# Patient Record
Sex: Male | Born: 1955 | Race: White | Hispanic: No | Marital: Married | Smoking: Never smoker
Health system: Southern US, Community
[De-identification: ages and names within clinical notes are randomized; demographics above are authoritative.]

## PROBLEM LIST (undated history)

## (undated) DIAGNOSIS — F419 Anxiety disorder, unspecified: Secondary | ICD-10-CM

## (undated) DIAGNOSIS — K219 Gastro-esophageal reflux disease without esophagitis: Secondary | ICD-10-CM

## (undated) DIAGNOSIS — E039 Hypothyroidism, unspecified: Secondary | ICD-10-CM

## (undated) DIAGNOSIS — L501 Idiopathic urticaria: Secondary | ICD-10-CM

## (undated) DIAGNOSIS — M79671 Pain in right foot: Secondary | ICD-10-CM

## (undated) DIAGNOSIS — M2041 Other hammer toe(s) (acquired), right foot: Secondary | ICD-10-CM

## (undated) DIAGNOSIS — M310 Hypersensitivity angiitis: Secondary | ICD-10-CM

## (undated) DIAGNOSIS — M545 Low back pain, unspecified: Secondary | ICD-10-CM

## (undated) HISTORY — DX: Anxiety disorder, unspecified: F41.9

## (undated) HISTORY — PX: COLONOSCOPY: SHX174

## (undated) HISTORY — DX: Hypersensitivity angiitis: M31.0

## (undated) HISTORY — DX: Idiopathic urticaria: L50.1

## (undated) HISTORY — DX: Other hammer toe(s) (acquired), right foot: M20.41

## (undated) HISTORY — DX: Low back pain, unspecified: M54.50

## (undated) HISTORY — DX: Pain in right foot: M79.671

---

## 2014-11-15 ENCOUNTER — Ambulatory Visit (HOSPITAL_COMMUNITY)
Admission: RE | Admit: 2014-11-15 | Discharge: 2014-11-15 | Disposition: A | Discharge status: Home / Self Care | Payer: 59 | Source: Ambulatory Visit | Attending: Orthopedic Surgery | Admitting: Orthopedic Surgery

## 2014-11-15 ENCOUNTER — Other Ambulatory Visit (HOSPITAL_COMMUNITY): Payer: Self-pay | Admitting: Orthopedic Surgery

## 2014-11-15 DIAGNOSIS — Z139 Encounter for screening, unspecified: Secondary | ICD-10-CM | POA: Diagnosis present

## 2015-10-06 ENCOUNTER — Encounter: Payer: Self-pay | Admitting: Allergy and Immunology

## 2015-10-06 ENCOUNTER — Ambulatory Visit (INDEPENDENT_AMBULATORY_CARE_PROVIDER_SITE_OTHER): Payer: 59 | Admitting: Allergy and Immunology

## 2015-10-06 VITALS — BP 110/68 | HR 84 | Resp 16

## 2015-10-06 DIAGNOSIS — L509 Urticaria, unspecified: Secondary | ICD-10-CM

## 2015-10-06 MED ORDER — HYDROXYZINE HCL 10 MG PO TABS: ORAL_TABLET | ORAL | Status: DC

## 2015-10-06 NOTE — Patient Instructions (Signed)
Take Home Sheet  1. Avoidance:  Of scratching and triggers as discussed.   2. Antihistamine: Zyrtec 10mg  by mouth twice daily for itching.  3. Continue Zantac 150mg  twice daily.  4.  Hydroxyzine 10mg  at needed for itching/hives.  5.  Continue Dapsone per Dr. Sherryl Bartersafeen's instructions.  6. Continue Singulair 10mg  each evening.  7. Other:  May have at home Prednisone 10mg  tablets --total of 4 tabs--call office if needed.   Review Xolair information   Review with Dr. Dimas AguasHoward sleep management                         Review with Orthopedic MD pain management options as discussed.   8. Follow up Visit:  4 months or sooner if needed.    Call office with decision about submitting to insurance for Xolair approval and or keeping [facility]     Allergy/Immunology appointment.   Websites that have reliable Patient information: 1. American Academy of Asthma, Allergy, & Immunology: www.aaaai.org 2. Food Allergy Network: www.foodallergy.org 3. Mothers of Asthmatics: www.aanma.org 4. [facility]: https://www.strong.com/www.njc.org 5. American College of Allergy, Asthma, & Immunology: BiggerRewards.iswww.allergy.mcg.edu or www.acaai.org

## 2015-10-18 NOTE — Progress Notes (Signed)
FOLLOW UP NOTE  RE: Troy Orozco MRN: [id number] DOB: [birth year] [facility] [place] [address] [place] [id number] Date of Office Visit: 10/06/2015  Subjective:  Troy MalkinDavid Orozco is a 59 y.o. male who presents today for Urticaria  Assessment:   1. Hives, chronic.   Plan:   Meds ordered this encounter  Medications  . hydrOXYzine (ATARAX/VISTARIL) 10 MG tablet    Sig: Take 1-2 tablets daily at bedtime as needed for itching.    Dispense:  60 tablet    Refill:  1   Patient Instructions   1. Avoidance:  Of scratching and triggers as discussed.  2. Antihistamine: Zyrtec 10mg  by mouth twice daily for itching.  3. Continue Zantac 150mg  twice daily.  4.  Hydroxyzine 10mg  at needed for itching/hives.  5.  Continue Dapsone per Dr. Sherryl Bartersafeen's instructions.  6. Continue Singulair 10mg  each evening.  7. Other:  May have at home Prednisone 10mg  tablets --total of 4 tabs--call office if needed.   Review Xolair information   Review with Dr. Dimas AguasHoward sleep management                         Review with Orthopedic MD pain management options as discussed.   8. Follow up Visit:  4 months or sooner if needed.    Call office with decision about submitting to insurance for Xolair approval and or keeping [facility] All[facility] appointment.    HPI: Troy Orozco to the office regarding hives.  Since his last visit he has had visits with Dr. Jorja Loaafeen, last in July.  He generally describes much better control since increased dose of Dapsone.  No severe flares in many months.  He took Prednisone 3 weeks ago from primary MD for a few days of mild hives.  He occasionally notes mild, though rare areas at arms/sides and occasionally at neck and lower back.  No recent itching.  And denies swelling or new concerns for GI or respiratory symptoms. He does report following with Orthopedics regarding right rotator cuff and biceps  tendon--recurring pain and third shift sleep issues. He is requesting to have a few Prednisone at home if hive flare.  Current Medications: 1.  Zyrtec 10mg  twice daily. 2.  Singulair 10mg  each evening. 3.  Dapsone 100mg  daily (3 months at this dose). 4.  Hydroxyzine as needed. 5.  Zantac 150mg  twice daily.  Drug Allergies: No Known Allergies  Objective:   Filed Vitals:   10/06/15 0928                        10/05/15 0843  BP:              110/68                                       152/86  Pulse:                                                     84  Resp:  16   Physical Exam  Constitutional: He is well-developed, well-nourished, and in no distress.  HENT:  Head: Atraumatic.  Right Ear: Tympanic membrane and ear canal normal.  Left Ear: Tympanic membrane and ear canal normal.  Nose: Mucosal edema present. No rhinorrhea. No epistaxis.  Mouth/Throat: Oropharynx is clear and moist and mucous membranes are normal. No oropharyngeal exudate, posterior oropharyngeal edema or posterior oropharyngeal erythema.  Eyes: Conjunctivae are normal.  Neck: Neck supple.  Cardiovascular: Normal rate, S1 normal and S2 normal.   No murmur heard. Pulmonary/Chest: Effort normal and breath sounds normal. He has no wheezes. He has no rhonchi. He has no rales.  Lymphadenopathy:    He has no cervical adenopathy.  Skin: Skin is warm and intact. No rash noted. No cyanosis. Nails show no clubbing.  No hives or skin changes.     Bennette Hasty M. Willa Rough, MD  cc: Selinda Flavin, MD

## 2016-02-02 ENCOUNTER — Ambulatory Visit: Payer: 59 | Admitting: Allergy and Immunology

## 2018-07-14 NOTE — Patient Instructions (Addendum)
Towanda MalkinDavid Rothenberger  07/14/2018   Your procedure is scheduled on: 07-28-18   Report to [facility] Main  Entrance    Report to Admitting at 12:20 PM    Call this number if you have problems the morning of surgery [phone]   Remember: Do not eat food or drink liquids :After Midnight. You may have a Clear Liquid Diet from Midnight until 8:50 AM. After 8:50 AM, nothing until after surgery.     CLEAR LIQUID DIET   Foods Allowed                                                                     Foods Excluded  Coffee and tea, regular and decaf                             liquids that you cannot  Plain Jell-O in any flavor                                             see through such as: Fruit ices (not with fruit pulp)                                     milk, soups, orange juice  Iced Popsicles                                    All solid food Carbonated beverages, regular and diet                                    Cranberry, grape and apple juices Sports drinks like Gatorade Lightly seasoned clear broth or consume(fat free) Sugar, honey syrup  Sample Menu Breakfast                                Lunch                                     Supper Cranberry juice                    Beef broth                            Chicken broth Jell-O                                     Grape juice  Apple juice Coffee or tea                        Jell-O                                      Popsicle                                                Coffee or tea                        Coffee or tea  _____________________________________________________________________     Take these medicines the morning of surgery with A SIP OF WATER: Cetirizine (Zyrtec), and Ranitidine (Zantac)                                You may not have any metal on your body including hair pins and              piercings  Do not wear jewelry, lotions, powders, cologne or  deodorant             Men may shave face and neck.   Do not bring valuables to the hospital. [facility] IS NOT             RESPONSIBLE   FOR VALUABLES.  Contacts, dentures or bridgework may not be worn into surgery.  Leave suitcase in the car. After surgery it may be brought to your room.   Special Instructions: N/A              Please read over the following fact sheets you were given: _____________________________________________________________________          [facility] - Preparing for Surgery Before surgery, you can play an important role.  Because skin is not sterile, your skin needs to be as free of germs as possible.  You can reduce the number of germs on your skin by washing with CHG (chlorahexidine gluconate) soap before surgery.  CHG is an antiseptic cleaner which kills germs and bonds with the skin to continue killing germs even after washing. Please DO NOT use if you have an allergy to CHG or antibacterial soaps.  If your skin becomes reddened/irritated stop using the CHG and inform your nurse when you arrive at Short Stay. Do not shave (including legs and underarms) for at least 48 hours prior to the first CHG shower.  You may shave your face/neck. Please follow these instructions carefully:  1.  Shower with CHG Soap the night before surgery and the  morning of Surgery.  2.  If you choose to wash your hair, wash your hair first as usual with your  normal  shampoo.  3.  After you shampoo, rinse your hair and body thoroughly to remove the  shampoo.                           4.  Use CHG as you would any other liquid soap.  You can apply chg directly  to the skin and wash  Gently with a scrungie or clean washcloth.  5.  Apply the CHG Soap to your body ONLY FROM THE NECK DOWN.   Do not use on face/ open                           Wound or open sores. Avoid contact with eyes, ears mouth and genitals (private parts).                       Wash face,  Genitals  (private parts) with your normal soap.             6.  Wash thoroughly, paying special attention to the area where your surgery  will be performed.  7.  Thoroughly rinse your body with warm water from the neck down.  8.  DO NOT shower/wash with your normal soap after using and rinsing off  the CHG Soap.                9.  Pat yourself dry with a clean towel.            10.  Wear clean pajamas.            11.  Place clean sheets on your bed the night of your first shower and do not  sleep with pets. Day of Surgery : Do not apply any lotions/deodorants the morning of surgery.  Please wear clean clothes to the hospital/surgery center.  FAILURE TO FOLLOW THESE INSTRUCTIONS MAY RESULT IN THE CANCELLATION OF YOUR SURGERY PATIENT SIGNATURE_________________________________  NURSE SIGNATURE__________________________________  ________________________________________________________________________   Adam Phenix  An incentive spirometer is a tool that can help keep your lungs clear and active. This tool measures how well you are filling your lungs with each breath. Taking long deep breaths may help reverse or decrease the chance of developing breathing (pulmonary) problems (especially infection) following:  A long period of time when you are unable to move or be active. BEFORE THE PROCEDURE   If the spirometer includes an indicator to show your best effort, your nurse or respiratory therapist will set it to a desired goal.  If possible, sit up straight or lean slightly forward. Try not to slouch.  Hold the incentive spirometer in an upright position. INSTRUCTIONS FOR USE  1. Sit on the edge of your bed if possible, or sit up as far as you can in bed or on a chair. 2. Hold the incentive spirometer in an upright position. 3. Breathe out normally. 4. Place the mouthpiece in your mouth and seal your lips tightly around it. 5. Breathe in slowly and as deeply as possible, raising the  piston or the ball toward the top of the column. 6. Hold your breath for 3-5 seconds or for as long as possible. Allow the piston or ball to fall to the bottom of the column. 7. Remove the mouthpiece from your mouth and breathe out normally. 8. Rest for a few seconds and repeat Steps 1 through 7 at least 10 times every 1-2 hours when you are awake. Take your time and take a few normal breaths between deep breaths. 9. The spirometer may include an indicator to show your best effort. Use the indicator as a goal to work toward during each repetition. 10. After each set of 10 deep breaths, practice coughing to be sure your lungs are clear. If you have an incision (the cut made at the time of  surgery), support your incision when coughing by placing a pillow or rolled up towels firmly against it. Once you are able to get out of bed, walk around indoors and cough well. You may stop using the incentive spirometer when instructed by your caregiver.  RISKS AND COMPLICATIONS  Take your time so you do not get dizzy or light-headed.  If you are in pain, you may need to take or ask for pain medication before doing incentive spirometry. It is harder to take a deep breath if you are having pain. AFTER USE  Rest and breathe slowly and easily.  It can be helpful to keep track of a log of your progress. Your caregiver can provide you with a simple table to help with this. If you are using the spirometer at home, follow these instructions: [place] IF:   You are having difficultly using the spirometer.  You have trouble using the spirometer as often as instructed.  Your pain medication is not giving enough relief while using the spirometer.  You develop fever of 100.5 F (38.1 C) or higher. SEEK IMMEDIATE MEDICAL CARE IF:   You cough up bloody sputum that had not been present before.  You develop fever of 102 F (38.9 C) or greater.  You develop worsening pain at or near the incision  site. MAKE SURE YOU:   Understand these instructions.  Will watch your condition.  Will get help right away if you are not doing well or get worse. Document Released: 04/01/2007 Document Revised: 02/11/2012 Document Reviewed: 06/02/2007 ExitCare Patient Information 2014 ExitCare, [place].   ________________________________________________________________________  WHAT IS A BLOOD TRANSFUSION? Blood Transfusion Information  A transfusion is the replacement of blood or some of its parts. Blood is made up of multiple cells which provide different functions.  Red blood cells carry oxygen and are used for blood loss replacement.  White blood cells fight against infection.  Platelets control bleeding.  Plasma helps clot blood.  Other blood products are available for specialized needs, such as hemophilia or other clotting disorders. BEFORE THE TRANSFUSION  Who gives blood for transfusions?   Healthy volunteers who are fully evaluated to make sure their blood is safe. This is blood bank blood. Transfusion therapy is the safest it has ever been in the practice of medicine. Before blood is taken from a donor, a complete history is taken to make sure that person has no history of diseases nor engages in risky social behavior (examples are intravenous drug use or sexual activity with multiple partners). The donor's travel history is screened to minimize risk of transmitting infections, such as malaria. The donated blood is tested for signs of infectious diseases, such as HIV and hepatitis. The blood is then tested to be sure it is compatible with you in order to minimize the chance of a transfusion reaction. If you or a relative donates blood, this is often done in anticipation of surgery and is not appropriate for emergency situations. It takes many days to process the donated blood. RISKS AND COMPLICATIONS Although transfusion therapy is very safe and saves many lives, the main dangers of  transfusion include:   Getting an infectious disease.  Developing a transfusion reaction. This is an allergic reaction to something in the blood you were given. Every precaution is taken to prevent this. The decision to have a blood transfusion has been considered carefully by your caregiver before blood is given. Blood is not given unless the benefits outweigh the risks. AFTER THE  TRANSFUSION  Right after receiving a blood transfusion, you will usually feel much better and more energetic. This is especially true if your red blood cells have gotten low (anemic). The transfusion raises the level of the red blood cells which carry oxygen, and this usually causes an energy increase.  The nurse administering the transfusion will monitor you carefully for complications. HOME CARE INSTRUCTIONS  No special instructions are needed after a transfusion. You may find your energy is better. Speak with your caregiver about any limitations on activity for underlying diseases you may have. SEEK MEDICAL CARE IF:   Your condition is not improving after your transfusion.  You develop redness or irritation at the intravenous (IV) site. SEEK IMMEDIATE MEDICAL CARE IF:  Any of the following symptoms occur over the next 12 hours:  Shaking chills.  You have a temperature by mouth above 102 F (38.9 C), not controlled by medicine.  Chest, back, or muscle pain.  People around you feel you are not acting correctly or are confused.  Shortness of breath or difficulty breathing.  Dizziness and fainting.  You get a rash or develop hives.  You have a decrease in urine output.  Your urine turns a dark color or changes to pink, red, or Barbian. Any of the following symptoms occur over the next 10 days:  You have a temperature by mouth above 102 F (38.9 C), not controlled by medicine.  Shortness of breath.  Weakness after normal activity.  The white part of the eye turns yellow (jaundice).  You have a  decrease in the amount of urine or are urinating less often.  Your urine turns a dark color or changes to pink, red, or [place]. Document Released: 11/16/2000 Document Revised: 02/11/2012 Document Reviewed: 07/05/2008 [facility] Patient Information 2014 [place], [place].  _______________________________________________________________________

## 2018-07-16 ENCOUNTER — Encounter (HOSPITAL_COMMUNITY): Payer: Self-pay

## 2018-07-16 ENCOUNTER — Encounter (HOSPITAL_COMMUNITY)
Admission: RE | Admit: 2018-07-16 | Discharge: 2018-07-16 | Disposition: A | Discharge status: Home / Self Care | Payer: POS | Source: Ambulatory Visit | Attending: Orthopedic Surgery | Admitting: Orthopedic Surgery

## 2018-07-16 ENCOUNTER — Other Ambulatory Visit: Payer: Self-pay

## 2018-07-16 DIAGNOSIS — Z01812 Encounter for preprocedural laboratory examination: Secondary | ICD-10-CM | POA: Insufficient documentation

## 2018-07-16 DIAGNOSIS — M1612 Unilateral primary osteoarthritis, left hip: Secondary | ICD-10-CM | POA: Insufficient documentation

## 2018-07-16 HISTORY — DX: Hypothyroidism, unspecified: E03.9

## 2018-07-16 HISTORY — DX: Gastro-esophageal reflux disease without esophagitis: K21.9

## 2018-07-16 LAB — PROTIME-INR
INR: 0.98
Prothrombin Time: 12.8 seconds (ref 11.4–15.2)

## 2018-07-16 LAB — SURGICAL PCR SCREEN
MRSA, PCR: NEGATIVE
STAPHYLOCOCCUS AUREUS: NEGATIVE

## 2018-07-16 LAB — APTT: APTT: 32 s (ref 24–36)

## 2018-07-16 NOTE — Progress Notes (Addendum)
07-15-18 Surgical Clearance,  EKG, CBC, CMET, and HGA1C on chart

## 2018-07-24 NOTE — H&P (Signed)
TOTAL HIP ADMISSION H&P  Patient is admitted for left total hip arthroplasty.  Subjective:  Chief Complaint: left hip pain  HPI: Troy Orozco, 62 y.o. male, has a history of pain and functional disability in the left hip(s) due to arthritis and patient has failed non-surgical conservative treatments for greater than 12 weeks to include NSAID's and/or analgesics and activity modification.  Onset of symptoms was abrupt starting 1 years ago with rapidly worsening course since that time.The patient noted no past surgery on the left hip(s).  Patient currently rates pain in the left hip at 7 out of 10 with activity. Patient has worsening of pain with activity and weight bearing and pain with passive range of motion. Patient has evidence of bone-on-bone arthritis with marginal osteophyte formation by imaging studies. This condition presents safety issues increasing the risk of falls. There is no current active infection.  There are no active problems to display for this patient.  Past Medical History:  Diagnosis Date  . GERD (gastroesophageal reflux disease)   . Hypothyroidism     Past Surgical History:  Procedure Laterality Date  . COLONOSCOPY      No current facility-administered medications for this encounter.    Current Outpatient Medications  Medication Sig Dispense Refill Last Dose  . acetaminophen (TYLENOL 8 HOUR ARTHRITIS PAIN) 650 MG CR tablet Take 650-1,300 mg by mouth every 8 (eight) hours as needed for pain.     . Ascorbic Acid (VITAMIN C) 1000 MG tablet Take 1,000 mg by mouth daily.     . B Complex-C (SUPER B COMPLEX PO) Take 1 capsule by mouth daily.     . Cholecalciferol (VITAMIN D-3) 5000 units TABS Take 5,000 Units by mouth daily.     . dapsone 25 MG tablet Take 25 mg by mouth daily.   8   . Histamine Dihydrochloride (AUSTRALIAN DREAM ARTHRITIS EX) Apply 1 application topically 4 (four) times daily as needed (for pain.).     Marland Kitchen levothyroxine (SYNTHROID, LEVOTHROID) 50 MCG  tablet Take 50 mcg by mouth daily.  2   . lidocaine (XYLOCAINE) 5 % ointment Apply 1-2 g topically 4 (four) times daily as needed for pain.  0   . Magnesium 250 MG TABS Take 250 mg by mouth daily.     . meloxicam (MOBIC) 15 MG tablet Take 15 mg by mouth daily.  1   . Multiple Vitamin (MULTIVITAMIN WITH MINERALS) TABS tablet Take 1 tablet by mouth daily.     . NONFORMULARY OR COMPOUNDED ITEM Apply 1-2 g topically 4 (four) times daily as needed for pain. C-Bacl-Gaba-Ibu-Pril-2%-2%-5%-2% Cr  99    No Known Allergies  Social History   Tobacco Use  . Smoking status: Never Smoker  . Smokeless tobacco: Never Used  Substance Use Topics  . Alcohol use: Yes    Alcohol/week: 1.0 standard drinks    Types: 1 Cans of beer per week    Comment: weekly    No family history on file.   Review of Systems  Constitutional: Negative for chills and fever.  HENT: Negative for congestion, sore throat and tinnitus.   Eyes: Negative for double vision, photophobia and pain.  Respiratory: Negative for cough, shortness of breath and wheezing.   Cardiovascular: Negative for chest pain, palpitations and orthopnea.  Gastrointestinal: Negative for heartburn, nausea and vomiting.  Genitourinary: Negative for dysuria, frequency and urgency.  Musculoskeletal: Positive for joint pain.  Neurological: Negative for dizziness, weakness and headaches.  Psychiatric/Behavioral: Negative for depression.  Objective:  Physical Exam  Well nourished and well developed. General: Alert and oriented x3, cooperative and pleasant, no acute distress. Head: normocephalic, atraumatic, neck supple. Eyes: EOMI. Respiratory: breath sounds clear in all fields, no wheezing, rales, or rhonchi. Cardiovascular: Regular rate and rhythm, no murmurs, gallops or rubs.  Abdomen: non-tender to palpation and soft, normoactive bowel sounds. Musculoskeletal: Antalgic gait pattern favoring the left side without the use of assisted  devices. Right Hip Exam: ROM: Normal without discomfort. There is no tenderness over the greater trochanter bursa. There is no pain on provocative testing of the hip. Right Knee Exam: No effusion. Range of motion is 0-125 degrees. No crepitus on range of motion of the knee. No medial joint line tendernessNo lateral joint line tenderness. Stable knee. Left Hip Exam:ROM: Flexion to 100, Internal Rotation 0, External Rotation 10, and Abduction 20 degrees. There is no tenderness over the greater trochanter bursa. Left Knee Exam: No effusion. Range of motion is 0-125 degrees. No crepitus on range of motion of the knee. No medial joint line tendernessNo lateral joint line tenderness. Stable knee. Calves soft and nontender. Motor function intact in LE. Strength 5/5 LE bilaterally. Neuro: Distal pulses 2+. Sensation to light touch intact in LE.  Vital signs in last 24 hours:  Blood pressure: 138/86 mmHg Pulse: 84 bpm  Labs:   Estimated body mass index is 31.36 kg/m as calculated from the following:   Height as of 07/16/18: 5\' 7"  (1.702 m).   Weight as of 07/16/18: 90.8 kg.   Imaging Review Plain radiographs demonstrate severe degenerative joint disease of the left hip(s). The bone quality appears to be adequate for age and reported activity level.    Preoperative templating of the joint replacement has been completed, documented, and submitted to the Operating Room personnel in order to optimize intra-operative equipment management.     Assessment/Plan:  End stage arthritis, left hip(s)  The patient history, physical examination, clinical judgement of the provider and imaging studies are consistent with end stage degenerative joint disease of the left hip(s) and total hip arthroplasty is deemed medically necessary. The treatment options including medical management, injection therapy, arthroscopy and arthroplasty were discussed at length. The risks and benefits of total hip arthroplasty were  presented and reviewed. The risks due to aseptic loosening, infection, stiffness, dislocation/subluxation,  thromboembolic complications and other imponderables were discussed.  The patient acknowledged the explanation, agreed to proceed with the plan and consent was signed. Patient is being admitted for inpatient treatment for surgery, pain control, PT, OT, prophylactic antibiotics, VTE prophylaxis, progressive ambulation and ADL's and discharge planning.The patient is planning to be discharged home with home exercise program.    Therapy Plans: HEP Disposition: Home with wife and son Planned DVT Prophylaxis: Aspirin 325 mg BID DME needed: None PCP: Dr. Selinda FlavinKevin Howard ([facility]) TXA: IV Allergies: None Other: Pt had appointment with Dr. Dimas AguasHoward approximately 3 weeks ago. Clearance form provided to the patient to take by office.   - Patient was instructed on what medications to stop prior to surgery. - Follow-up visit in 2 weeks with Dr. Lequita HaltAluisio - Begin physical therapy following surgery - Pre-operative lab work as pre-surgical testing - Prescriptions will be provided in hospital at time of discharge  Arther AbbottKristie Fiona Coto, PA-C Orthopedic Surgery [facility] [place]

## 2018-07-25 NOTE — Progress Notes (Signed)
07-25-18 Pt made aware that surgery time has changed from 1450 to 1400. Pt to arrive at Admission at 1200, and to remain on a Clear Liquid Diet until 8:00 AM. After 8:00 AM, nothing until after surgery. Pt verbalized understanding.

## 2018-07-28 ENCOUNTER — Ambulatory Visit (HOSPITAL_COMMUNITY): Payer: POS

## 2018-07-28 ENCOUNTER — Ambulatory Visit (HOSPITAL_COMMUNITY): Payer: POS | Admitting: Anesthesiology

## 2018-07-28 ENCOUNTER — Inpatient Hospital Stay (HOSPITAL_COMMUNITY)
Admission: RE | Admit: 2018-07-28 | Discharge: 2018-07-29 | DRG: 470 | Disposition: A | Discharge status: Home Health | Payer: POS | Source: Ambulatory Visit | Attending: Orthopedic Surgery | Admitting: Orthopedic Surgery

## 2018-07-28 ENCOUNTER — Other Ambulatory Visit: Payer: Self-pay

## 2018-07-28 ENCOUNTER — Inpatient Hospital Stay (HOSPITAL_COMMUNITY): Payer: POS

## 2018-07-28 ENCOUNTER — Encounter (HOSPITAL_COMMUNITY): Payer: Self-pay | Admitting: *Deleted

## 2018-07-28 ENCOUNTER — Encounter (HOSPITAL_COMMUNITY)
Admission: RE | Disposition: A | Discharge status: Home Health | Payer: Self-pay | Source: Ambulatory Visit | Attending: Orthopedic Surgery

## 2018-07-28 DIAGNOSIS — Z96649 Presence of unspecified artificial hip joint: Secondary | ICD-10-CM

## 2018-07-28 DIAGNOSIS — E039 Hypothyroidism, unspecified: Secondary | ICD-10-CM | POA: Diagnosis present

## 2018-07-28 DIAGNOSIS — M1710 Unilateral primary osteoarthritis, unspecified knee: Secondary | ICD-10-CM | POA: Diagnosis present

## 2018-07-28 DIAGNOSIS — Z791 Long term (current) use of non-steroidal anti-inflammatories (NSAID): Secondary | ICD-10-CM

## 2018-07-28 DIAGNOSIS — K219 Gastro-esophageal reflux disease without esophagitis: Secondary | ICD-10-CM | POA: Diagnosis present

## 2018-07-28 DIAGNOSIS — M1612 Unilateral primary osteoarthritis, left hip: Secondary | ICD-10-CM | POA: Diagnosis present

## 2018-07-28 DIAGNOSIS — M179 Osteoarthritis of knee, unspecified: Secondary | ICD-10-CM | POA: Diagnosis present

## 2018-07-28 DIAGNOSIS — Z79890 Hormone replacement therapy (postmenopausal): Secondary | ICD-10-CM | POA: Diagnosis not present

## 2018-07-28 DIAGNOSIS — M169 Osteoarthritis of hip, unspecified: Secondary | ICD-10-CM | POA: Diagnosis present

## 2018-07-28 DIAGNOSIS — M25552 Pain in left hip: Secondary | ICD-10-CM | POA: Diagnosis present

## 2018-07-28 HISTORY — PX: TOTAL HIP ARTHROPLASTY: SHX124

## 2018-07-28 LAB — TYPE AND SCREEN
ABO/RH(D): O POS
Antibody Screen: NEGATIVE

## 2018-07-28 LAB — ABO/RH: ABO/RH(D): O POS

## 2018-07-28 SURGERY — ARTHROPLASTY, HIP, TOTAL, ANTERIOR APPROACH
Anesthesia: Spinal | Site: Hip | Laterality: Left

## 2018-07-28 MED ORDER — SODIUM CHLORIDE 0.9 % IV SOLN
INTRAVENOUS | Status: DC | PRN
  Administered 2018-07-28: 25 ug/min via INTRAVENOUS

## 2018-07-28 MED ORDER — HYDROCODONE-ACETAMINOPHEN 5-325 MG PO TABS: 1.0000 | ORAL_TABLET | ORAL | Status: DC | PRN

## 2018-07-28 MED ORDER — DEXAMETHASONE SODIUM PHOSPHATE 10 MG/ML IJ SOLN
8.0000 mg | Freq: Once | INTRAMUSCULAR | Status: AC
  Administered 2018-07-28: 10 mg via INTRAVENOUS

## 2018-07-28 MED ORDER — CHLORHEXIDINE GLUCONATE 4 % EX LIQD: 60.0000 mL | Freq: Once | CUTANEOUS | Status: DC

## 2018-07-28 MED ORDER — BUPIVACAINE-EPINEPHRINE (PF) 0.25% -1:200000 IJ SOLN
INTRAMUSCULAR | Status: DC | PRN
  Administered 2018-07-28: 30 mL

## 2018-07-28 MED ORDER — METOCLOPRAMIDE HCL 5 MG PO TABS: 5.0000 mg | ORAL_TABLET | Freq: Three times a day (TID) | ORAL | Status: DC | PRN

## 2018-07-28 MED ORDER — PHENOL 1.4 % MT LIQD: 1.0000 | OROMUCOSAL | Status: DC | PRN

## 2018-07-28 MED ORDER — DIPHENHYDRAMINE HCL 12.5 MG/5ML PO ELIX: 12.5000 mg | ORAL_SOLUTION | ORAL | Status: DC | PRN

## 2018-07-28 MED ORDER — ACETAMINOPHEN 10 MG/ML IV SOLN
1000.0000 mg | Freq: Four times a day (QID) | INTRAVENOUS | Status: DC
  Administered 2018-07-28: 1000 mg via INTRAVENOUS
  Filled 2018-07-28: qty 100

## 2018-07-28 MED ORDER — PHENYLEPHRINE HCL 10 MG/ML IJ SOLN
INTRAMUSCULAR | Status: AC
  Filled 2018-07-28: qty 1

## 2018-07-28 MED ORDER — FENTANYL CITRATE (PF) 100 MCG/2ML IJ SOLN
INTRAMUSCULAR | Status: DC | PRN
  Administered 2018-07-28 (×2): 50 ug via INTRAVENOUS

## 2018-07-28 MED ORDER — POLYETHYLENE GLYCOL 3350 17 G PO PACK: 17.0000 g | PACK | Freq: Every day | ORAL | Status: DC | PRN

## 2018-07-28 MED ORDER — MIDAZOLAM HCL 5 MG/5ML IJ SOLN
INTRAMUSCULAR | Status: DC | PRN
  Administered 2018-07-28 (×2): 1 mg via INTRAVENOUS

## 2018-07-28 MED ORDER — FENTANYL CITRATE (PF) 100 MCG/2ML IJ SOLN: 25.0000 ug | INTRAMUSCULAR | Status: DC | PRN

## 2018-07-28 MED ORDER — SODIUM CHLORIDE 0.9 % IV SOLN
INTRAVENOUS | Status: DC
  Administered 2018-07-28 – 2018-07-29 (×2): via INTRAVENOUS

## 2018-07-28 MED ORDER — TRAMADOL HCL 50 MG PO TABS: 50.0000 mg | ORAL_TABLET | Freq: Four times a day (QID) | ORAL | Status: DC | PRN

## 2018-07-28 MED ORDER — OXYCODONE HCL 5 MG/5ML PO SOLN
5.0000 mg | Freq: Once | ORAL | Status: DC | PRN
  Filled 2018-07-28: qty 5

## 2018-07-28 MED ORDER — ONDANSETRON HCL 4 MG PO TABS: 4.0000 mg | ORAL_TABLET | Freq: Four times a day (QID) | ORAL | Status: DC | PRN

## 2018-07-28 MED ORDER — DEXAMETHASONE SODIUM PHOSPHATE 10 MG/ML IJ SOLN
10.0000 mg | Freq: Once | INTRAMUSCULAR | Status: AC
  Administered 2018-07-29: 10 mg via INTRAVENOUS
  Filled 2018-07-28: qty 1

## 2018-07-28 MED ORDER — LACTATED RINGERS IV SOLN
INTRAVENOUS | Status: DC
  Administered 2018-07-28 (×3): via INTRAVENOUS

## 2018-07-28 MED ORDER — METHOCARBAMOL 500 MG IVPB - SIMPLE MED
INTRAVENOUS | Status: AC
  Filled 2018-07-28: qty 50

## 2018-07-28 MED ORDER — MORPHINE SULFATE (PF) 2 MG/ML IV SOLN
0.5000 mg | INTRAVENOUS | Status: DC | PRN
  Administered 2018-07-28: 1 mg via INTRAVENOUS
  Filled 2018-07-28: qty 1

## 2018-07-28 MED ORDER — PROPOFOL 500 MG/50ML IV EMUL
INTRAVENOUS | Status: DC | PRN
  Administered 2018-07-28: 100 ug/kg/min via INTRAVENOUS

## 2018-07-28 MED ORDER — SODIUM CHLORIDE 0.9 % IV SOLN
1000.0000 mg | INTRAVENOUS | Status: AC
  Administered 2018-07-28: 1000 mg via INTRAVENOUS
  Filled 2018-07-28: qty 10

## 2018-07-28 MED ORDER — TRANEXAMIC ACID 1000 MG/10ML IV SOLN
1000.0000 mg | Freq: Once | INTRAVENOUS | Status: AC
  Administered 2018-07-28: 1000 mg via INTRAVENOUS
  Filled 2018-07-28: qty 1000

## 2018-07-28 MED ORDER — HYDROCODONE-ACETAMINOPHEN 7.5-325 MG PO TABS
1.0000 | ORAL_TABLET | ORAL | Status: DC | PRN
  Administered 2018-07-28 (×2): 1 via ORAL
  Administered 2018-07-29 (×2): 2 via ORAL
  Filled 2018-07-28: qty 2
  Filled 2018-07-28: qty 1
  Filled 2018-07-28: qty 2
  Filled 2018-07-28: qty 1

## 2018-07-28 MED ORDER — CEFAZOLIN SODIUM-DEXTROSE 2-4 GM/100ML-% IV SOLN
2.0000 g | INTRAVENOUS | Status: AC
  Administered 2018-07-28: 2 g via INTRAVENOUS

## 2018-07-28 MED ORDER — PROPOFOL 10 MG/ML IV BOLUS
INTRAVENOUS | Status: AC
  Filled 2018-07-28: qty 60

## 2018-07-28 MED ORDER — DOCUSATE SODIUM 100 MG PO CAPS
100.0000 mg | ORAL_CAPSULE | Freq: Two times a day (BID) | ORAL | Status: DC
  Administered 2018-07-28 – 2018-07-29 (×2): 100 mg via ORAL
  Filled 2018-07-28 (×2): qty 1

## 2018-07-28 MED ORDER — ONDANSETRON HCL 4 MG/2ML IJ SOLN: 4.0000 mg | Freq: Four times a day (QID) | INTRAMUSCULAR | Status: DC | PRN

## 2018-07-28 MED ORDER — FLEET ENEMA 7-19 GM/118ML RE ENEM: 1.0000 | ENEMA | Freq: Once | RECTAL | Status: DC | PRN

## 2018-07-28 MED ORDER — MENTHOL 3 MG MT LOZG: 1.0000 | LOZENGE | OROMUCOSAL | Status: DC | PRN

## 2018-07-28 MED ORDER — ACETAMINOPHEN 500 MG PO TABS
500.0000 mg | ORAL_TABLET | Freq: Four times a day (QID) | ORAL | Status: DC
  Administered 2018-07-28 – 2018-07-29 (×3): 500 mg via ORAL
  Filled 2018-07-28 (×3): qty 1

## 2018-07-28 MED ORDER — METHOCARBAMOL 500 MG PO TABS: 500.0000 mg | ORAL_TABLET | Freq: Four times a day (QID) | ORAL | Status: DC | PRN

## 2018-07-28 MED ORDER — CEFAZOLIN SODIUM-DEXTROSE 2-4 GM/100ML-% IV SOLN
2.0000 g | Freq: Four times a day (QID) | INTRAVENOUS | Status: AC
  Administered 2018-07-28 – 2018-07-29 (×2): 2 g via INTRAVENOUS
  Filled 2018-07-28 (×2): qty 100

## 2018-07-28 MED ORDER — METOCLOPRAMIDE HCL 5 MG/ML IJ SOLN: 5.0000 mg | Freq: Three times a day (TID) | INTRAMUSCULAR | Status: DC | PRN

## 2018-07-28 MED ORDER — BUPIVACAINE IN DEXTROSE 0.75-8.25 % IT SOLN
INTRATHECAL | Status: DC | PRN
  Administered 2018-07-28: 1.8 mL via INTRATHECAL

## 2018-07-28 MED ORDER — ONDANSETRON HCL 4 MG/2ML IJ SOLN
INTRAMUSCULAR | Status: DC | PRN
  Administered 2018-07-28: 4 mg via INTRAVENOUS

## 2018-07-28 MED ORDER — ASPIRIN EC 325 MG PO TBEC
325.0000 mg | DELAYED_RELEASE_TABLET | Freq: Two times a day (BID) | ORAL | Status: DC
  Administered 2018-07-29: 325 mg via ORAL
  Filled 2018-07-28: qty 1

## 2018-07-28 MED ORDER — LEVOTHYROXINE SODIUM 50 MCG PO TABS
50.0000 ug | ORAL_TABLET | Freq: Every day | ORAL | Status: DC
  Administered 2018-07-29: 50 ug via ORAL
  Filled 2018-07-28 (×2): qty 1

## 2018-07-28 MED ORDER — LEVOTHYROXINE SODIUM 50 MCG PO TABS
50.0000 ug | ORAL_TABLET | ORAL | Status: AC
  Administered 2018-07-28: 50 ug via ORAL
  Filled 2018-07-28: qty 1

## 2018-07-28 MED ORDER — ALBUMIN HUMAN 5 % IV SOLN
INTRAVENOUS | Status: DC | PRN
  Administered 2018-07-28: 16:00:00 via INTRAVENOUS

## 2018-07-28 MED ORDER — FENTANYL CITRATE (PF) 100 MCG/2ML IJ SOLN
INTRAMUSCULAR | Status: AC
Start: 2018-07-28 — End: ?
  Filled 2018-07-28: qty 2

## 2018-07-28 MED ORDER — DEXAMETHASONE SODIUM PHOSPHATE 10 MG/ML IJ SOLN
INTRAMUSCULAR | Status: AC
Start: 2018-07-28 — End: ?
  Filled 2018-07-28: qty 1

## 2018-07-28 MED ORDER — BISACODYL 10 MG RE SUPP: 10.0000 mg | Freq: Every day | RECTAL | Status: DC | PRN

## 2018-07-28 MED ORDER — LACTATED RINGERS IV SOLN: INTRAVENOUS | Status: DC

## 2018-07-28 MED ORDER — BUPIVACAINE-EPINEPHRINE (PF) 0.25% -1:200000 IJ SOLN
INTRAMUSCULAR | Status: AC
  Filled 2018-07-28: qty 30

## 2018-07-28 MED ORDER — SODIUM CHLORIDE 0.9 % IR SOLN
Status: DC | PRN
  Administered 2018-07-28: 1000 mL

## 2018-07-28 MED ORDER — PROPOFOL 10 MG/ML IV BOLUS
INTRAVENOUS | Status: AC
  Filled 2018-07-28: qty 20

## 2018-07-28 MED ORDER — MIDAZOLAM HCL 2 MG/2ML IJ SOLN
INTRAMUSCULAR | Status: AC
  Filled 2018-07-28: qty 2

## 2018-07-28 MED ORDER — METHOCARBAMOL 500 MG IVPB - SIMPLE MED
500.0000 mg | Freq: Four times a day (QID) | INTRAVENOUS | Status: DC | PRN
  Administered 2018-07-28: 500 mg via INTRAVENOUS
  Filled 2018-07-28: qty 50

## 2018-07-28 MED ORDER — ONDANSETRON HCL 4 MG/2ML IJ SOLN: 4.0000 mg | Freq: Once | INTRAMUSCULAR | Status: DC | PRN

## 2018-07-28 MED ORDER — ONDANSETRON HCL 4 MG/2ML IJ SOLN
INTRAMUSCULAR | Status: AC
Start: 2018-07-28 — End: ?
  Filled 2018-07-28: qty 2

## 2018-07-28 MED ORDER — STERILE WATER FOR IRRIGATION IR SOLN
Status: DC | PRN
  Administered 2018-07-28: 2000 mL

## 2018-07-28 MED ORDER — OXYCODONE HCL 5 MG PO TABS: 5.0000 mg | ORAL_TABLET | Freq: Once | ORAL | Status: DC | PRN

## 2018-07-28 MED ORDER — 0.9 % SODIUM CHLORIDE (POUR BTL) OPTIME
TOPICAL | Status: DC | PRN
  Administered 2018-07-28: 1000 mL

## 2018-07-28 SURGICAL SUPPLY — 41 items
BAG DECANTER FOR FLEXI CONT (MISCELLANEOUS) ×3 IMPLANT
BAG ZIPLOCK 12X15 (MISCELLANEOUS) IMPLANT
BLADE SAG 18X100X1.27 (BLADE) ×3 IMPLANT
CLOSURE WOUND 1/2 X4 (GAUZE/BANDAGES/DRESSINGS) ×2
COVER PERINEAL POST (MISCELLANEOUS) ×3 IMPLANT
COVER SURGICAL LIGHT HANDLE (MISCELLANEOUS) ×3 IMPLANT
CUP ACET PINNACLE SECTR 50MM (Hips) ×1 IMPLANT
DECANTER SPIKE VIAL GLASS SM (MISCELLANEOUS) ×3 IMPLANT
DRAPE STERI IOBAN 125X83 (DRAPES) ×3 IMPLANT
DRAPE U-SHAPE 47X51 STRL (DRAPES) ×6 IMPLANT
DRSG ADAPTIC 3X8 NADH LF (GAUZE/BANDAGES/DRESSINGS) ×3 IMPLANT
DRSG MEPILEX BORDER 4X4 (GAUZE/BANDAGES/DRESSINGS) ×3 IMPLANT
DRSG MEPILEX BORDER 4X8 (GAUZE/BANDAGES/DRESSINGS) ×3 IMPLANT
DURAPREP 26ML APPLICATOR (WOUND CARE) ×3 IMPLANT
ELECT REM PT RETURN 15FT ADLT (MISCELLANEOUS) ×3 IMPLANT
EVACUATOR 1/8 PVC DRAIN (DRAIN) ×3 IMPLANT
GLOVE BIO SURGEON STRL SZ7 (GLOVE) ×3 IMPLANT
GLOVE BIO SURGEON STRL SZ8 (GLOVE) ×3 IMPLANT
GLOVE BIOGEL PI IND STRL 7.0 (GLOVE) ×1 IMPLANT
GLOVE BIOGEL PI IND STRL 8 (GLOVE) ×1 IMPLANT
GLOVE BIOGEL PI INDICATOR 7.0 (GLOVE) ×2
GLOVE BIOGEL PI INDICATOR 8 (GLOVE) ×2
GOWN STRL REUS W/TWL LRG LVL3 (GOWN DISPOSABLE) ×3 IMPLANT
GOWN STRL REUS W/TWL XL LVL3 (GOWN DISPOSABLE) ×3 IMPLANT
HEAD FEMORAL 32 CERAMIC (Hips) ×3 IMPLANT
HOLDER FOLEY CATH W/STRAP (MISCELLANEOUS) ×3 IMPLANT
LINER MARATHON 32 50 (Hips) ×3 IMPLANT
MANIFOLD NEPTUNE II (INSTRUMENTS) ×3 IMPLANT
PACK ANTERIOR HIP CUSTOM (KITS) ×3 IMPLANT
PINNACLE SECTOR CUP 50MM (Hips) ×3 IMPLANT
STEM FEMORAL SZ5 HIGH ACTIS (Nail) ×3 IMPLANT
STRIP CLOSURE SKIN 1/2X4 (GAUZE/BANDAGES/DRESSINGS) ×4 IMPLANT
SUT ETHIBOND NAB CT1 #1 30IN (SUTURE) ×3 IMPLANT
SUT MNCRL AB 4-0 PS2 18 (SUTURE) ×3 IMPLANT
SUT STRATAFIX 0 PDS 27 VIOLET (SUTURE) ×3
SUT VIC AB 2-0 CT1 27 (SUTURE) ×4
SUT VIC AB 2-0 CT1 TAPERPNT 27 (SUTURE) ×2 IMPLANT
SUTURE STRATFX 0 PDS 27 VIOLET (SUTURE) ×1 IMPLANT
SYR 50ML LL SCALE MARK (SYRINGE) ×3 IMPLANT
TRAY FOLEY MTR SLVR 16FR STAT (SET/KITS/TRAYS/PACK) ×3 IMPLANT
YANKAUER SUCT BULB TIP 10FT TU (MISCELLANEOUS) ×3 IMPLANT

## 2018-07-28 NOTE — Anesthesia Procedure Notes (Signed)
Spinal  Patient location during procedure: OR End time: 07/28/2018 2:37 PM Staffing Resident/CRNA: Evans, Janet E, CRNA Performed: resident/CRNA  Preanesthetic Checklist Completed: patient identified, site marked, surgical consent, pre-op evaluation, timeout performed, IV checked, risks and benefits discussed and monitors and equipment checked Spinal Block Patient position: sitting Prep: DuraPrep Patient monitoring: heart rate, continuous pulse ox and blood pressure Approach: midline Location: L4-5 Injection technique: single-shot Needle Needle type: Pencan  Needle gauge: 24 G Needle length: 9 cm Additional Notes Expiration date of kit checked and confirmed. Patient tolerated procedure well, without complications.       

## 2018-07-28 NOTE — Interval H&P Note (Signed)
History and Physical Interval Note:  07/28/2018 12:21 PM  Troy MalkinDavid Bursch  has presented today for surgery, with the diagnosis of left hip osteoarthritis  The various methods of treatment have been discussed with the patient and family. After consideration of risks, benefits and other options for treatment, the patient has consented to  Procedure(s) with comments: LEFT TOTAL HIP ARTHROPLASTY ANTERIOR APPROACH (Left) - 100min as a surgical intervention .  The patient's history has been reviewed, patient examined, no change in status, stable for surgery.  I have reviewed the patient's chart and labs.  Questions were answered to the patient's satisfaction.     Homero FellersFrank Brodie Correll

## 2018-07-28 NOTE — Anesthesia Procedure Notes (Signed)
Procedure Name: MAC Date/Time: 07/28/2018 2:32 PM Performed by: Lissa Morales, CRNA Pre-anesthesia Checklist: Patient identified, Emergency Drugs available, Suction available, Timeout performed and Patient being monitored Patient Re-evaluated:Patient Re-evaluated prior to induction Oxygen Delivery Method: Simple face mask Placement Confirmation: positive ETCO2

## 2018-07-28 NOTE — Op Note (Signed)
OPERATIVE REPORT- TOTAL HIP ARTHROPLASTY   PREOPERATIVE DIAGNOSIS: Osteoarthritis of the Left hip.   POSTOPERATIVE DIAGNOSIS: Osteoarthritis of the Left  hip.   PROCEDURE: Left total hip arthroplasty, anterior approach.   SURGEON: Ollen GrossFrank Daeron Carreno, MD   ASSISTANT: Dimitri PedAmber Constable, PA-C  ANESTHESIA:  Spinal  ESTIMATED BLOOD LOSS:-800 mL    DRAINS: Hemovac x1.   COMPLICATIONS: None   CONDITION: PACU - hemodynamically stable.   BRIEF CLINICAL NOTE: Troy Orozco is a 62 y.o. male who has advanced end-  stage arthritis of their Left  hip with progressively worsening pain and  dysfunction.The patient has failed nonoperative management and presents for  total hip arthroplasty.   PROCEDURE IN DETAIL: After successful administration of spinal  anesthetic, the traction boots for the [facility] bed were placed on both  feet and the patient was [facility] the [facility], boots placed into the leg  holders. The Left hip was then isolated from the perineum with plastic  drapes and prepped and draped in the usual sterile fashion. ASIS and  greater trochanter were marked and a oblique incision was made, starting  at about 1 cm lateral and 2 cm distal to the ASIS and coursing towards  the anterior cortex of the femur. The skin was cut with a 10 blade  through subcutaneous tissue to the level of the fascia overlying the  tensor fascia lata muscle. The fascia was then incised in line with the  incision at the junction of the anterior third and posterior 2/3rd. The  muscle was teased off the fascia and then the interval between the TFL  and the rectus was developed. The Hohmann retractor was then placed at  the top of the femoral neck over the capsule. The vessels overlying the  capsule were cauterized and the fat on top of the capsule was removed.  A Hohmann retractor was then placed anterior underneath the rectus  femoris to give exposure to the entire anterior capsule. A T-shaped   capsulotomy was performed. The edges were tagged and the femoral head  was identified.       Osteophytes are removed off the superior acetabulum.  The femoral neck was then cut in situ with an oscillating saw. Traction  was then applied to the left lower extremity utilizing the [facility]  traction. The femoral head was then removed. Retractors were placed  around the acetabulum and then circumferential removal of the labrum was  performed. Osteophytes were also removed. Reaming starts at 47 mm to  medialize and  Increased in 2 mm increments to 49 mm. We reamed in  approximately 40 degrees of abduction, 20 degrees anteversion. A 50 mm  pinnacle acetabular shell was then impacted in anatomic position under  fluoroscopic guidance with excellent purchase. We did not need to place  any additional dome screws. A 32 mm neutral + 4 marathon liner was then  placed into the acetabular shell.       The femoral lift was then placed along the lateral aspect of the femur  just distal to the vastus ridge. The leg was  externally rotated and capsule  was stripped off the inferior aspect of the femoral neck down to the  level of the lesser trochanter, this was done with electrocautery. The femur was lifted after this was performed. The  leg was then placed in an extended and adducted position essentially delivering the femur. We also removed the capsule superiorly and the piriformis from the piriformis fossa  to gain excellent exposure of the  proximal femur. Rongeur was used to remove some cancellous bone to get  into the lateral portion of the proximal femur for placement of the  initial starter reamer. The starter broaches was placed  the starter broach  and was shown to go down the center of the canal. Broaching  with the Actis system was then performed starting at size 0  coursing  Up to size 5. A size 5 had excellent torsional and rotational  and axial stability. The trial high offset neck was then placed   with a 32 + 1 trial head. The hip was then reduced. We confirmed that  the stem was in the canal both on AP and lateral x-rays. It also has excellent sizing. The hip was reduced with outstanding stability through full extension and full external rotation.. AP pelvis was taken and the leg lengths were measured and found to be equal. Hip was then dislocated again and the femoral head and neck removed. The  femoral broach was removed. Size 5 Actis stem with a high offset  neck was then impacted into the femur following native anteversion. Has  excellent purchase in the canal. Excellent torsional and rotational and  axial stability. It is confirmed to be in the canal on AP and lateral  fluoroscopic views. The 32 + 1 ceramic head was placed and the hip  reduced with outstanding stability. Again AP pelvis was taken and it  confirmed that the leg lengths were equal. The wound was then copiously  irrigated with saline solution and the capsule reattached and repaired  with Ethibond suture. 30 ml of .25% Bupivicaine was  injected into the capsule and into the edge of the tensor fascia lata as well as subcutaneous tissue. The fascia overlying the tensor fascia lata was then closed with a running #1 V-Loc. Subcu was closed with interrupted 2-0 Vicryl and subcuticular running 4-0 Monocryl. Incision was cleaned  and dried. Steri-Strips and a bulky sterile dressing applied. Hemovac  drain was hooked to suction and then the patient was awakened and transported to  recovery in stable condition.        Please note that a surgical assistant was a medical necessity for this procedure to perform it in a safe and expeditious manner. Assistant was necessary to provide appropriate retraction of vital neurovascular structures and to prevent femoral fracture and allow for anatomic placement of the prosthesis.  Gaynelle Arabian, M.D.

## 2018-07-28 NOTE — Discharge Instructions (Signed)
°Dr. Frank Aluisio °Total Joint Specialist °[facility] °[address] °[place], [place] [address] °[phone] ° °ANTERIOR APPROACH TOTAL HIP REPLACEMENT POSTOPERATIVE DIRECTIONS ° ° °Hip Rehabilitation, Guidelines Following Surgery  °The results of a hip operation are greatly improved after range of motion and muscle strengthening exercises. Follow all safety measures which are given to protect your hip. If any of these exercises cause increased pain or swelling in your joint, decrease the amount until you are comfortable again. Then slowly increase the exercises. Call your caregiver if you have problems or questions.  ° °HOME CARE INSTRUCTIONS  °• Remove items at home which could result in a fall. This includes throw rugs or furniture in walking pathways.  °· ICE to the affected hip every three hours for 30 minutes at a time and then as needed for pain and swelling.  Continue to use ice on the hip for pain and swelling from surgery. You may notice swelling that will progress down to the foot and ankle.  This is normal after surgery.  Elevate the leg when you are not up walking on it.   °· Continue to use the breathing machine which will help keep your temperature down.  It is common for your temperature to cycle up and down following surgery, especially at night when you are not up moving around and exerting yourself.  The breathing machine keeps your lungs expanded and your temperature down. ° °DIET °You may resume your previous home diet once your are discharged from the hospital. ° °DRESSING / WOUND CARE / SHOWERING °You may shower 3 days after surgery, but keep the wounds dry during showering.  You may use an occlusive plastic wrap (Press'n Seal for example), NO SOAKING/SUBMERGING IN THE BATHTUB.  If the bandage gets wet, change with a clean dry gauze.  If the incision gets wet, pat the wound dry with a clean towel. °You may start showering once you are discharged home but do not submerge the  incision under water. Just pat the incision dry and apply a dry gauze dressing on daily. °Change the surgical dressing daily and reapply a dry dressing each time. ° °ACTIVITY °Walk with your walker as instructed. °Use walker as long as suggested by your caregivers. °Avoid periods of inactivity such as sitting longer than an hour when not asleep. This helps prevent blood clots.  °You may resume a sexual relationship in one month or when given the OK by your doctor.  °You may return to work once you are cleared by your doctor.  °Do not drive a car for 6 weeks or until released by you surgeon.  °Do not drive while taking narcotics. ° °WEIGHT BEARING °Weight bearing as tolerated with assist device (walker, cane, etc) as directed, use it as long as suggested by your surgeon or therapist, typically at least 4-6 weeks. ° °POSTOPERATIVE CONSTIPATION PROTOCOL °Constipation - defined medically as fewer than three stools per week and severe constipation as less than one stool per week. ° °One of the most common issues patients have following surgery is constipation.  Even if you have a regular bowel pattern at home, your normal regimen is likely to be disrupted due to multiple reasons following surgery.  Combination of anesthesia, postoperative narcotics, change in appetite and fluid intake all can affect your bowels.  In order to avoid complications following surgery, here are some recommendations in order to help you during your recovery period. ° °Colace (docusate) - Pick up an over-the-counter form   of Colace or another stool softener and take twice a day as long as you are requiring postoperative pain medications.  Take with a full glass of water daily.  If you experience loose stools or diarrhea, hold the colace until you stool forms back up.  If your symptoms do not get better within 1 week or if they get worse, check with your doctor. ° °Dulcolax (bisacodyl) - Pick up over-the-counter and take as directed by the product  packaging as needed to assist with the movement of your bowels.  Take with a full glass of water.  Use this product as needed if not relieved by Colace only.  ° °MiraLax (polyethylene glycol) - Pick up over-the-counter to have on hand.  MiraLax is a solution that will increase the amount of water in your bowels to assist with bowel movements.  Take as directed and can mix with a glass of water, juice, soda, coffee, or tea.  Take if you go more than two days without a movement. °Do not use MiraLax more than once per day. Call your doctor if you are still constipated or irregular after using this medication for 7 days in a row. ° °If you continue to have problems with postoperative constipation, please contact the office for further assistance and recommendations.  If you experience "the worst abdominal pain ever" or develop nausea or vomiting, please contact the office immediatly for further recommendations for treatment. ° °ITCHING ° If you experience itching with your medications, try taking only a single pain pill, or even half a pain pill at a time.  You can also use Benadryl over the counter for itching or also to help with sleep.  ° °TED HOSE STOCKINGS °Wear the elastic stockings on both legs for three weeks following surgery during the day but you may remove then at night for sleeping. ° °MEDICATIONS °See your medication summary on the “After Visit Summary” that the nursing staff will review with you prior to discharge.  You may have some home medications which will be placed on hold until you complete the course of blood thinner medication.  It is important for you to complete the blood thinner medication as prescribed by your surgeon.  Continue your approved medications as instructed at time of discharge. ° °PRECAUTIONS °If you experience chest pain or shortness of breath - call 911 immediately for transfer to the hospital emergency department.  °If you develop a fever greater that 101 F, purulent drainage  from wound, increased redness or drainage from wound, foul odor from the wound/dressing, or calf pain - CONTACT YOUR SURGEON.   °                                                °FOLLOW-UP APPOINTMENTS °Make sure you keep all of your appointments after your operation with your surgeon and caregivers. You should call the office at the above phone number and make an appointment for approximately two weeks after the date of your surgery or on the date instructed by your surgeon outlined in the "After Visit Summary". ° °RANGE OF MOTION AND STRENGTHENING EXERCISES  °These exercises are designed to help you keep full movement of your hip joint. Follow your caregiver's or physical therapist's instructions. Perform all exercises about fifteen times, three times per day or as directed. Exercise both hips, even if you have   had only one joint replacement. These exercises can be done on a training (exercise) mat, on the floor, on a table or on a bed. Use whatever works the best and is most comfortable for you. Use music or television while you are exercising so that the exercises are a pleasant break in your day. This will make your life better with the exercises acting as a break in routine you can look forward to.  °• Lying on your back, slowly slide your foot toward your buttocks, raising your knee up off the floor. Then slowly slide your foot back down until your leg is straight again.  °• Lying on your back spread your legs as far apart as you can without causing discomfort.  °• Lying on your side, raise your upper leg and foot straight up from the floor as far as is comfortable. Slowly lower the leg and repeat.  °• Lying on your back, tighten up the muscle in the front of your thigh (quadriceps muscles). You can do this by keeping your leg straight and trying to raise your heel off the floor. This helps strengthen the largest muscle supporting your knee.  °• Lying on your back, tighten up the muscles of your buttocks both  with the legs straight and with the knee bent at a comfortable angle while keeping your heel on the floor.  ° °IF YOU ARE TRANSFERRED TO A SKILLED REHAB FACILITY °If the patient is transferred to a skilled rehab facility following release from the hospital, a list of the current medications will be sent to the facility for the patient to continue.  When discharged from the skilled rehab facility, please have the facility set up the patient's Home Health Physical Therapy prior to being released. Also, the skilled facility will be responsible for providing the patient with their medications at time of release from the facility to include their pain medication, the muscle relaxants, and their blood thinner medication. If the patient is still at the rehab facility at time of the two week follow up appointment, the skilled rehab facility will also need to assist the patient in arranging follow up appointment in our office and any transportation needs. ° °MAKE SURE YOU:  °• Understand these instructions.  °• Get help right away if you are not doing well or get worse.  ° ° °Pick up stool softner and laxative for home use following surgery while on pain medications. °Do not submerge incision under water. °Please use good hand washing techniques while changing dressing each day. °May shower starting three days after surgery. °Please use a clean towel to pat the incision dry following showers. °Continue to use ice for pain and swelling after surgery. °Do not use any lotions or creams on the incision until instructed by your surgeon. ° °

## 2018-07-28 NOTE — Transfer of Care (Signed)
Immediate Anesthesia Transfer of Care Note  Patient: Troy MalkinDavid Orozco  Procedure(s) Performed: LEFT TOTAL HIP ARTHROPLASTY ANTERIOR APPROACH (Left Hip)  Patient Location: PACU  Anesthesia Type:Spinal  Level of Consciousness: awake, alert  and patient cooperative  Airway & Oxygen Therapy: Patient Spontanous Breathing and Patient connected to face mask oxygen  Post-op Assessment: Report given to RN and Post -op Vital signs reviewed and stable  Post vital signs: stable  Last Vitals:  Vitals Value Taken Time  BP 104/57 07/28/2018  4:16 PM  Temp    Pulse 81 07/28/2018  4:21 PM  Resp 20 07/28/2018  4:21 PM  SpO2 98 % 07/28/2018  4:21 PM  Vitals shown include unvalidated device data.  Last Pain:  Vitals:   07/28/18 1241  PainSc: 0-No pain         Complications: No apparent anesthesia complications

## 2018-07-28 NOTE — Plan of Care (Signed)
Plan of care 

## 2018-07-28 NOTE — Anesthesia Postprocedure Evaluation (Signed)
Anesthesia Post Note  Patient: Troy MalkinDavid Orozco  Procedure(s) Performed: LEFT TOTAL HIP ARTHROPLASTY ANTERIOR APPROACH (Left Hip)     Patient location during evaluation: PACU Anesthesia Type: Spinal Level of consciousness: awake and alert Pain management: pain level controlled Vital Signs Assessment: post-procedure vital signs reviewed and stable Respiratory status: spontaneous breathing and respiratory function stable Cardiovascular status: blood pressure returned to baseline and stable Postop Assessment: spinal receding and no apparent nausea or vomiting Anesthetic complications: no    Last Vitals:  Vitals:   07/28/18 1715 07/28/18 1741  BP: 108/61 117/76  Pulse: 67 68  Resp: 15 16  Temp:  36.8 C  SpO2: 98% 98%    Last Pain:  Vitals:   07/28/18 1741  TempSrc: Oral  PainSc:                  Beryle Lathehomas E Devra Stare

## 2018-07-28 NOTE — Anesthesia Preprocedure Evaluation (Addendum)
Anesthesia Evaluation  Patient identified by MRN, date of birth, ID band Patient awake    Reviewed: Allergy & Precautions, NPO status , Patient's Chart, lab work & pertinent test results  History of Anesthesia Complications Negative for: history of anesthetic complications  Airway Mallampati: II  TM Distance: >3 FB Neck ROM: Full    Dental  (+) Dental Advisory Given, Teeth Intact   Pulmonary neg pulmonary ROS,    breath sounds clear to auscultation       Cardiovascular negative cardio ROS   Rhythm:Regular Rate:Normal     Neuro/Psych negative neurological ROS  negative psych ROS   GI/Hepatic Neg liver ROS, GERD  Medicated and Controlled,  Endo/Other  Hypothyroidism  Obesity   Renal/GU negative Renal ROS  negative genitourinary   Musculoskeletal  (+) Arthritis ,   Abdominal   Peds  Hematology negative hematology ROS (+)   Anesthesia Other Findings   Reproductive/Obstetrics                            Anesthesia Physical Anesthesia Plan  ASA: II  Anesthesia Plan: Spinal   Post-op Pain Management:    Induction:   PONV Risk Score and Plan: 1 and Treatment may vary due to age or medical condition and Propofol infusion  Airway Management Planned: Natural Airway and Simple Face Mask  Additional Equipment: None  Intra-op Plan:   Post-operative Plan:   Informed Consent: I have reviewed the patients History and Physical, chart, labs and discussed the procedure including the risks, benefits and alternatives for the proposed anesthesia with the patient or authorized representative who has indicated his/her understanding and acceptance.     Plan Discussed with: CRNA and Anesthesiologist  Anesthesia Plan Comments: (Labs reviewed, platelets acceptable. Discussed risks and benefits of spinal, including spinal/epidural hematoma, infection, failed block, and PDPH. Patient expressed  understanding and wished to proceed. )       Anesthesia Quick Evaluation

## 2018-07-29 ENCOUNTER — Encounter (HOSPITAL_COMMUNITY): Payer: Self-pay | Admitting: Orthopedic Surgery

## 2018-07-29 LAB — BASIC METABOLIC PANEL
ANION GAP: 7 (ref 5–15)
BUN: 15 mg/dL (ref 8–23)
CHLORIDE: 108 mmol/L (ref 98–111)
CO2: 24 mmol/L (ref 22–32)
Calcium: 8.7 mg/dL — ABNORMAL LOW (ref 8.9–10.3)
Creatinine, Ser: 0.92 mg/dL (ref 0.61–1.24)
GFR calc non Af Amer: >60 mL/min (ref >60)
GLUCOSE: 140 mg/dL — AB (ref 70–99)
POTASSIUM: 4 mmol/L (ref 3.5–5.1)
Sodium: 139 mmol/L (ref 135–145)

## 2018-07-29 LAB — CBC
HEMATOCRIT: 36 % — AB (ref 39.0–52.0)
HEMOGLOBIN: 12.3 g/dL — AB (ref 13.0–17.0)
MCH: 32.3 pg (ref 26.0–34.0)
MCHC: 34.2 g/dL (ref 30.0–36.0)
MCV: 94.5 fL (ref 78.0–100.0)
Platelets: 190 10*3/uL (ref 150–400)
RBC: 3.81 MIL/uL — ABNORMAL LOW (ref 4.22–5.81)
RDW: 13.1 % (ref 11.5–15.5)
WBC: 10.8 10*3/uL — AB (ref 4.0–10.5)

## 2018-07-29 MED ORDER — TRAMADOL HCL 50 MG PO TABS: 50.0000 mg | ORAL_TABLET | Freq: Four times a day (QID) | ORAL | 0 refills | Status: DC | PRN

## 2018-07-29 MED ORDER — HYDROCODONE-ACETAMINOPHEN 5-325 MG PO TABS: 1.0000 | ORAL_TABLET | Freq: Four times a day (QID) | ORAL | 0 refills | Status: DC | PRN

## 2018-07-29 MED ORDER — METHOCARBAMOL 500 MG PO TABS: 500.0000 mg | ORAL_TABLET | Freq: Four times a day (QID) | ORAL | 0 refills | Status: DC | PRN

## 2018-07-29 MED ORDER — ASPIRIN 325 MG PO TBEC: 325.0000 mg | DELAYED_RELEASE_TABLET | Freq: Two times a day (BID) | ORAL | 0 refills | Status: AC

## 2018-07-29 MED ORDER — ASPIRIN 325 MG PO TBEC: 325.0000 mg | DELAYED_RELEASE_TABLET | Freq: Two times a day (BID) | ORAL | 0 refills | Status: DC

## 2018-07-29 NOTE — Care Management Note (Addendum)
Case Management Note  Patient Details  Name: Troy MalkinDavid Orozco MRN: [id number] Date of Birth: 03/14/1956  Subjective/Objective:       Spoke with patient at bedside. Confirmed plan for HEP, already arranged. Has RW and 3n1. [phone]             Action/Plan:   Expected Discharge Date:  07/29/18               Expected Discharge Plan:  Home/Self Care  In-House Referral:  NA  Discharge planning Services  NA  Post Acute Care Choice:  NA Choice offered to:  Patient  DME Arranged:  N/A DME Agency:  NA  HH Arranged:  NA HH Agency:  NA  Status of Service:  Completed, signed off  If discussed at Long Length of Stay Meetings, dates discussed:    Additional Comments:  Alexis Goodelleele, Kycen Spalla K, RN 07/29/2018, 9:30 AM

## 2018-07-29 NOTE — Evaluation (Signed)
Physical Therapy Evaluation Patient Details Name: Troy Orozco MRN: [id number] DOB: Dec 16, 1955 Today's Date: 07/29/2018   History of Present Illness  Pt is a 62 year old male s/p L direct anterior THA  Clinical Impression  Pt is s/p THA resulting in the deficits listed below (see PT Problem List).  Pt will benefit from skilled PT to increase their independence and safety with mobility to allow discharge to the venue listed below.  Pt a little unsteady this morning however this improved with duration of session. Pt educated on and performed exercises.  Pt possibly to d/c home later today.  Will return to practice safe stair technique hopefully when family present.     Follow Up Recommendations Follow surgeon's recommendation for DC plan and follow-up therapies;No PT follow up    Equipment Recommendations  None recommended by PT    Recommendations for Other Services       Precautions / Restrictions Precautions Precautions: Fall Restrictions Weight Bearing Restrictions: No Other Position/Activity Restrictions: WBAT      Mobility  Bed Mobility Overal bed mobility: Needs Assistance Bed Mobility: Supine to Sit     Supine to sit: Min guard;HOB elevated     General bed mobility comments: verbal cues for self assist  Transfers Overall transfer level: Needs assistance Equipment used: Rolling walker (2 wheeled) Transfers: Sit to/from Stand Sit to Stand: Min assist         General transfer comment: assist to rise and steady, verbal cues for UE and LE positioning  Ambulation/Gait Ambulation/Gait assistance: Min assist;Min guard Gait Distance (Feet): 120 Feet Assistive device: Rolling walker (2 wheeled) Gait Pattern/deviations: Decreased stance time - left;Antalgic Gait velocity: decr   General Gait Details: initially min assist for steadying; verbal cues for sequence, RW positioning, step length, distance to tolerance  Stairs            Wheelchair Mobility     Modified Rankin (Stroke Patients Only)       Balance                                             Pertinent Vitals/Pain Pain Assessment: 0-10 Pain Score: 6  Pain Location: L Hip Pain Descriptors / Indicators: Sore;Aching Pain Intervention(s): Limited activity within patient's tolerance;Repositioned;Monitored during session;Patient requesting pain meds-RN notified    Home Living Family/patient expects to be discharged to:: Private residence Living Arrangements: Spouse/significant other Available Help at Discharge: Family;Available 24 hours/day Type of Home: House Home Access: Stairs to enter     Home Layout: Two level Home Equipment: Environmental consultant - 2 wheels;Cane - single point;Bedside commode Additional Comments: Pt reports his neighbor is a PT and has loaned him DME    Prior Function Level of Independence: Independent               Hand Dominance        Extremity/Trunk Assessment   Upper Extremity Assessment Upper Extremity Assessment: Generalized weakness(reports bil shoulder rotator cuff issues, not able to be surgerically repaired per pt)    Lower Extremity Assessment Lower Extremity Assessment: LLE deficits/detail LLE Deficits / Details: anticipated post op hip weakness, grossly 2+/5 hip flexion and abduction       Communication   Communication: HOH  Cognition Arousal/Alertness: Awake/alert Behavior During Therapy: WFL for tasks assessed/performed Overall Cognitive Status: Within Functional Limits for tasks assessed  General Comments      Exercises Total Joint Exercises Ankle Circles/Pumps: AROM;Both;10 reps Heel Slides: AAROM;10 reps;Supine;Left Hip ABduction/ADduction: AROM;10 reps;Left;Standing(all standing exercises performed with UE support) Long Arc Quad: AROM;10 reps;Seated;Left Knee Flexion: AROM;10 reps;Standing;Left Marching in Standing: AROM;10  reps;Left;Standing Standing Hip Extension: AROM;10 reps;Left;Standing   Assessment/Plan    PT Assessment Patient needs continued PT services  PT Problem List Decreased strength;Decreased mobility;Decreased activity tolerance;Decreased knowledge of use of DME       PT Treatment Interventions Stair training;Gait training;Therapeutic exercise;DME instruction;Therapeutic activities;Patient/family education;Functional mobility training    PT Goals (Current goals can be found in the Care Plan section)  Acute Rehab PT Goals PT Goal Formulation: With patient Time For Goal Achievement: 08/02/18 Potential to Achieve Goals: Good    Frequency 7X/week   Barriers to discharge        Co-evaluation               AM-PAC PT "6 Clicks" Daily Activity  Outcome Measure Difficulty turning over in bed (including adjusting bedclothes, sheets and blankets)?: A Little Difficulty moving from lying on back to sitting on the side of the bed? : Unable Difficulty sitting down on and standing up from a chair with arms (e.g., wheelchair, bedside commode, etc,.)?: Unable Help needed moving to and from a bed to chair (including a wheelchair)?: A Little Help needed walking in hospital room?: A Little Help needed climbing 3-5 steps with a railing? : A Lot 6 Click Score: 13    End of Session Equipment Utilized During Treatment: Gait belt Activity Tolerance: Patient tolerated treatment well Patient left: in chair;with call bell/phone within reach;with nursing/sitter in room Nurse Communication: Mobility status PT Visit Diagnosis: Other abnormalities of gait and mobility (R26.89)    Time: [id number] PT Time Calculation (min) (ACUTE ONLY): 29 min   Charges:   PT Evaluation $PT Eval Low Complexity: 1 Low PT Treatments $Therapeutic Exercise: 8-22 mins       Zenovia JarredKati Donnita Farina, PT, DPT 07/29/2018 Pager: [phone]   Maida SaleLEMYRE,KATHrine E 07/29/2018, 11:23 AM

## 2018-07-29 NOTE — Progress Notes (Signed)
Physical Therapy Treatment Patient Details Name: Troy Orozco MRN: [id number] DOB: Apr 23, 1956 Today's Date: 07/29/2018    History of Present Illness Pt is a 62 year old male s/p L direct anterior THA    PT Comments    Pt practiced safe stair technique and reports understanding.  Pt also ambulated in hallway again.  Provided and reviewed HEP program verbally with pt.  Pt ready for d/c home today.   Follow Up Recommendations  Follow surgeon's recommendation for DC plan and follow-up therapies;No PT follow up     Equipment Recommendations  None recommended by PT    Recommendations for Other Services       Precautions / Restrictions Precautions Precautions: Fall Restrictions Other Position/Activity Restrictions: WBAT    Mobility  Bed Mobility Overal bed mobility: Needs Assistance Bed Mobility: Supine to Sit     Supine to sit: Min guard;HOB elevated     General bed mobility comments: verbal cues for self assist  Transfers Overall transfer level: Needs assistance Equipment used: Rolling walker (2 wheeled) Transfers: Sit to/from Stand Sit to Stand: Min guard;Supervision         General transfer comment: verbal cues for UE and LE positioning  Ambulation/Gait Ambulation/Gait assistance: Min guard;Supervision Gait Distance (Feet): 200 Feet Assistive device: Rolling walker (2 wheeled) Gait Pattern/deviations: Decreased stance time - left;Antalgic;Step-through pattern Gait velocity: decr   General Gait Details: verbal cues for RW positioning, step length   Stairs Stairs: Yes Stairs assistance: Min guard;Supervision Stair Management: One rail Right;Forwards;Step to pattern Number of Stairs: 3 General stair comments: verbal cues for safety and technique, pt able to perform well with one handrail, also has SPC at home if needed, performed twice   Wheelchair Mobility    Modified Rankin (Stroke Patients Only)       Balance                                             Cognition Arousal/Alertness: Awake/alert Behavior During Therapy: WFL for tasks assessed/performed Overall Cognitive Status: Within Functional Limits for tasks assessed                                        Exercises     General Comments        Pertinent Vitals/Pain Pain Assessment: 0-10 Pain Score: 4  Pain Location: L Hip Pain Descriptors / Indicators: Sore;Aching Pain Intervention(s): Limited activity within patient's tolerance;Repositioned;Monitored during session    Home Living Family/patient expects to be discharged to:: Private residence Living Arrangements: Spouse/significant other Available Help at Discharge: Family;Available 24 hours/day Type of Home: House Home Access: Stairs to enter   Home Layout: Two level Home Equipment: Environmental consultant - 2 wheels;Cane - single point;Bedside commode Additional Comments: Pt reports his neighbor is a PT and has loaned him DME    Prior Function Level of Independence: Independent          PT Goals (current goals can now be found in the care plan section) Acute Rehab PT Goals PT Goal Formulation: With patient Time For Goal Achievement: 08/02/18 Potential to Achieve Goals: Good Progress towards PT goals: Progressing toward goals    Frequency    7X/week      PT Plan Current plan remains appropriate    Co-evaluation  AM-PAC PT "6 Clicks" Daily Activity  Outcome Measure  Difficulty turning over in bed (including adjusting bedclothes, sheets and blankets)?: A Little Difficulty moving from lying on back to sitting on the side of the bed? : A Little Difficulty sitting down on and standing up from a chair with arms (e.g., wheelchair, bedside commode, etc,.)?: A Little Help needed moving to and from a bed to chair (including a wheelchair)?: A Little Help needed walking in hospital room?: A Little Help needed climbing 3-5 steps with a railing? : A Little 6 Click  Score: 18    End of Session Equipment Utilized During Treatment: Gait belt Activity Tolerance: Patient tolerated treatment well Patient left: in chair;with call bell/phone within reach;with nursing/sitter in room Nurse Communication: Mobility status PT Visit Diagnosis: Other abnormalities of gait and mobility (R26.89)     Time: [id number] PT Time Calculation (min) (ACUTE ONLY): 18 min  Charges:  $Gait Training: 8-22 mins                  Zenovia JarredKati Xzaiver Vayda, PT, DPT 07/29/2018 Pager: [phone]  Maida SaleLEMYRE,KATHrine E 07/29/2018, 1:52 PM

## 2018-07-29 NOTE — Progress Notes (Signed)
   Subjective: 1 Day Post-Op Procedure(s) (LRB): LEFT TOTAL HIP ARTHROPLASTY ANTERIOR APPROACH (Left) Patient reports pain as mild.   Patient seen in rounds by Dr. Lequita HaltAluisio. Patient is well, and has had no acute complaints or problems. States he is ready to go home. Denies chest pain or SOB. Foley catheter removed this AM.  We will start therapy today.   Objective: Vital signs in last 24 hours: Temp:  [97.6 F (36.4 C)-98.7 F (37.1 C)] 98.6 F (37 C) (08/27 0606) Pulse Rate:  [67-100] 85 (08/27 0606) Resp:  [14-20] 17 (08/27 0606) BP: (93-124)/(50-76) 105/63 (08/27 0606) SpO2:  [94 %-98 %] 98 % (08/27 0606) Weight:  [90.7 kg] 90.7 kg (08/26 1745)  Intake/Output from previous day:  Intake/Output Summary (Last 24 hours) at 07/29/2018 0715 Last data filed at 07/29/2018 0655 Gross per 24 hour  Intake 4130.4 ml  Output 3290 ml  Net 840.4 ml    Labs: Recent Labs    07/29/18 0409  HGB 12.3*   Recent Labs    07/29/18 0409  WBC 10.8*  RBC 3.81*  HCT 36.0*  PLT 190   Recent Labs    07/29/18 0409  NA 139  K 4.0  CL 108  CO2 24  BUN 15  CREATININE 0.92  GLUCOSE 140*  CALCIUM 8.7*   Exam: General - Patient is Alert and Oriented Extremity - Neurologically intact Neurovascular intact Sensation intact distally Dorsiflexion/Plantar flexion intact Dressing - dressing C/D/I and no drainage Motor Function - intact, moving foot and toes well on exam.   Past Medical History:  Diagnosis Date  . GERD (gastroesophageal reflux disease)   . Hypothyroidism     Assessment/Plan: 1 Day Post-Op Procedure(s) (LRB): LEFT TOTAL HIP ARTHROPLASTY ANTERIOR APPROACH (Left) Principal Problem:   OA (osteoarthritis) of knee Active Problems:   OA (osteoarthritis) of hip  Estimated body mass index is 31.32 kg/m as calculated from the following:   Height as of this encounter: 5\' 7"  (1.702 m).   Weight as of this encounter: 90.7 kg. Advance diet Up with therapy D/C IV  fluids  DVT Prophylaxis - Aspirin Weight bearing as tolerated. D/C O2 and pulse ox and try on room air. Hemovac pulled without difficulty, will begin therapy.  Plan is to go Home after hospital stay. Plan for discharge today after two sessions of therapy if meeting goals with HEP. Follow-up in the office in 2 weeks with Dr. Lequita HaltAluisio.  Arther AbbottKristie Edmisten, PA-C Orthopedic Surgery 07/29/2018, 7:15 AM

## 2018-07-31 NOTE — Discharge Summary (Signed)
Physician Discharge Summary   Patient ID: Troy Orozco MRN: [id number] DOB/AGE: [id number] y.o.  Admit date: 07/28/2018 Discharge date: 07/29/2018  Primary Diagnosis: Osteoarthritis left hip   Admission Diagnoses:  Past Medical History:  Diagnosis Date  . GERD (gastroesophageal reflux disease)   . Hypothyroidism    Discharge Diagnoses:   Principal Problem:   OA (osteoarthritis) of knee Active Problems:   OA (osteoarthritis) of hip  Estimated body mass index is 31.32 kg/m as calculated from the following:   Height as of this encounter: _0  (1.702 m).   Weight as of this encounter: 90.7 kg.  Procedure:  Procedure(s) (LRB): LEFT TOTAL HIP ARTHROPLASTY ANTERIOR APPROACH (Left)   Consults: None  HPI: Troy Orozco is a 62 y.o. male who has advanced end- stage arthritis of their Left  hip with progressively worsening pain and dysfunction.The patient has failed nonoperative management and presents for total hip arthroplasty.   Laboratory Data: Admission on 07/28/2018, Discharged on 07/29/2018  Component Date Value Ref Range Status  . ABO/RH(D) 07/28/2018 O POS   Final  . Antibody Screen 07/28/2018 NEG   Final  . Sample Expiration 07/28/2018    Final                   Value:07/31/2018 Performed at [facility], [place] [address] [place], [place] [address]   . ABO/RH(D) 07/28/2018    Final                   Value:O POS Performed at [facility], [place] [address] [place], [place] [address]   . WBC 07/29/2018 10.8* 4.0 - 10.5 K/uL Final  . RBC 07/29/2018 3.81* 4.22 - 5.81 MIL/uL Final  . Hemoglobin 07/29/2018 12.3* 13.0 - 17.0 g/dL Final  . HCT 07/29/2018 36.0* 39.0 - 52.0 % Final  . MCV 07/29/2018 94.5  78.0 - 100.0 fL Final  . MCH 07/29/2018 32.3  26.0 - 34.0 pg Final  . MCHC 07/29/2018 34.2  30.0 - 36.0 g/dL Final  . RDW 07/29/2018 13.1  11.5 - 15.5 % Final  . Platelets 07/29/2018 190  150 - 400 K/uL Final   Performed at [facility], [place] [address], [place], [place] [address]  . Sodium 07/29/2018 139  135 - 145 mmol/L Final  . Potassium 07/29/2018 4.0  3.5 - 5.1 mmol/L Final  . Chloride 07/29/2018 108  98 - 111 mmol/L Final  . CO2 07/29/2018 24  22 - 32 mmol/L Final  . Glucose, Bld 07/29/2018 140* 70 - 99 mg/dL Final  . BUN 07/29/2018 15  8 - 23 mg/dL Final  . Creatinine, Ser 07/29/2018 0.92  0.61 - 1.24 mg/dL Final  . Calcium 07/29/2018 8.7* 8.9 - 10.3 mg/dL Final  . GFR calc non Af Amer 07/29/2018 >60  >60 mL/min Final  . GFR calc Af Amer 07/29/2018 >60  >60 mL/min Final   Comment: (NOTE) The eGFR has been calculated using the CKD EPI equation. This calculation has not been validated in all clinical situations. eGFR's persistently <60 mL/min signify possible Chronic Kidney Disease.   Georgiann Hahn gap 07/29/2018 7  5 - 15 Final   Performed at [facility], [place] [address] [place], [place] [address]  Hospital Outpatient Visit on 07/16/2018  Component Date Value Ref Range Status  . aPTT 07/16/2018 32  24 - 36 seconds Final   Performed at [facility] [place], [place] [address], [place], [place] [address]  .  Prothrombin Time 07/16/2018 12.8  11.4 - 15.2 seconds Final  . INR 07/16/2018 0.98   Final   Performed at [facility], [place] [address] [place], [place] [address]  . MRSA, PCR 07/16/2018 NEGATIVE  NEGATIVE Final  . Staphylococcus aureus 07/16/2018 NEGATIVE  NEGATIVE Final   Comment: (NOTE) The Xpert SA Assay (FDA approved for NASAL specimens in patients 70 years of age and older), is one component of a comprehensive surveillance program. It is not intended to diagnose infection nor to guide or monitor treatment. Performed at [facility], [place] [address], [place],  [address]      X-Rays:Dg Pelvis Portable  Result Date: 07/28/2018 CLINICAL DATA:  Status post left hip arthroplasty. EXAM: PORTABLE PELVIS 1-2 VIEWS  COMPARISON:  Intraoperative fluoroscopic images earlier today FINDINGS: Sequelae of left total hip arthroplasty are identified. The prosthetic femoral and acetabular components are approximated with one another on this single AP image. A drain is in place, and there is postoperative gas in the overlying soft tissues. No acute fracture is identified. A Foley catheter is noted. IMPRESSION: Left total hip arthroplasty without evidence of acute complication. Electronically Signed   By: Logan Bores M.D.   On: 07/28/2018 16:47   Dg C-arm 1-60 Min-no Report  Result Date: 07/28/2018 Fluoroscopy was utilized by the requesting physician.  No radiographic interpretation.    EKG:No orders found for this or any previous visit.   Hospital Course: Troy Orozco is a 62 y.o. who was admitted to [facility]. They were brought to the operating room on 07/28/2018 and underwent Procedure(s): LEFT TOTAL HIP ARTHROPLASTY ANTERIOR APPROACH.  Patient tolerated the procedure well and was later transferred to the recovery room and then to the orthopaedic floor for postoperative care. They were given PO and IV analgesics for pain control following their surgery. They were given 24 hours of postoperative antibiotics of  Anti-infectives (From admission, onward)   Start     Dose/Rate Route Frequency Ordered Stop   07/29/18 0600  ceFAZolin (ANCEF) IVPB 2g/100 mL premix     2 g 200 mL/hr over 30 Minutes Intravenous On call to O.R. 07/28/18 1207 07/28/18 1446   07/28/18 2100  ceFAZolin (ANCEF) IVPB 2g/100 mL premix     2 g 200 mL/hr over 30 Minutes Intravenous Every 6 hours 07/28/18 1746 07/29/18 0232     and started on DVT prophylaxis in the form of Aspirin.   PT and OT were ordered for total joint protocol. Discharge planning consulted to help with postop disposition and equipment needs.  Patient had a good night on the evening of surgery. They started to get up OOB with therapy on POD #1. Pt was seen during rounds and  was ready to go home pending progress with therapy. Hemovac drain was pulled without difficulty. He worked with therapy on POD #1 and was meeting his goals. Pt was discharged to home later that day in stable condition.  Diet: Regular diet Activity: WBAT Follow-up: in 2 weeks with Dr. Wynelle Link Disposition: Home with home exercise program Discharged Condition: stable   Discharge Instructions    Call MD / Call 911   Complete by:  As directed    If you experience chest pain or shortness of breath, CALL 911 and be transported to the hospital emergency room.  If you develope a fever above 101 F, pus (white drainage) or increased drainage or redness at the wound, or calf pain, call your surgeon's office.  Change dressing   Complete by:  As directed    You may change your dressing on Wednesday (07/30/2018), then change the dressing daily with sterile 4 x 4 inch gauze dressing and paper tape.   Constipation Prevention   Complete by:  As directed    Drink plenty of fluids.  Prune juice may be helpful.  You may use a stool softener, such as Colace (over the counter) 100 mg twice a day.  Use MiraLax (over the counter) for constipation as needed.   Diet - low sodium heart healthy   Complete by:  As directed    Discharge instructions   Complete by:  As directed    Dr. Gaynelle Arabian Total Joint Specialist [facility] [address], [place], [place] [address] [phone]  ANTERIOR APPROACH TOTAL HIP REPLACEMENT POSTOPERATIVE DIRECTIONS   Hip Rehabilitation, Guidelines Following Surgery  The results of a hip operation are greatly improved after range of motion and muscle strengthening exercises. Follow all safety measures which are given to protect your hip. If any of these exercises cause increased pain or swelling in your joint, decrease the amount until you are comfortable again. Then slowly increase the exercises. Call your caregiver if you have problems or questions.   HOME CARE  INSTRUCTIONS  Remove items at home which could result in a fall. This includes throw rugs or furniture in walking pathways.  ICE to the affected hip every three hours for 30 minutes at a time and then as needed for pain and swelling.  Continue to use ice on the hip for pain and swelling from surgery. You may notice swelling that will progress down to the foot and ankle.  This is normal after surgery.  Elevate the leg when you are not up walking on it.   Continue to use the breathing machine which will help keep your temperature down.  It is common for your temperature to cycle up and down following surgery, especially at night when you are not up moving around and exerting yourself.  The breathing machine keeps your lungs expanded and your temperature down.  DIET You may resume your previous home diet once your are discharged from the hospital.  DRESSING / WOUND CARE / SHOWERING You may shower 3 days after surgery, but keep the wounds dry during showering.  You may use an occlusive plastic wrap (Press'n Seal for example), NO SOAKING/SUBMERGING IN THE BATHTUB.  If the bandage gets wet, change with a clean dry gauze.  If the incision gets wet, pat the wound dry with a clean towel. You may start showering once you are discharged home but do not submerge the incision under water. Just pat the incision dry and apply a dry gauze dressing on daily. Change the surgical dressing daily and reapply a dry dressing each time.  ACTIVITY Walk with your walker as instructed. Use walker as long as suggested by your caregivers. Avoid periods of inactivity such as sitting longer than an hour when not asleep. This helps prevent blood clots.  You may resume a sexual relationship in one month or when given the OK by your doctor.  You may return to work once you are cleared by your doctor.  Do not drive a car for 6 weeks or until released by you surgeon.  Do not drive while taking narcotics.  WEIGHT BEARING Weight  bearing as tolerated with assist device (walker, cane, etc) as directed, use it as long as suggested by your surgeon or  therapist, typically at least 4-6 weeks.  POSTOPERATIVE CONSTIPATION PROTOCOL Constipation - defined medically as fewer than three stools per week and severe constipation as less than one stool per week.  One of the most common issues patients have following surgery is constipation.  Even if you have a regular bowel pattern at home, your normal regimen is likely to be disrupted due to multiple reasons following surgery.  Combination of anesthesia, postoperative narcotics, change in appetite and fluid intake all can affect your bowels.  In order to avoid complications following surgery, here are some recommendations in order to help you during your recovery period.  Colace (docusate) - Pick up an over-the-counter form of Colace or another stool softener and take twice a day as long as you are requiring postoperative pain medications.  Take with a full glass of water daily.  If you experience loose stools or diarrhea, hold the colace until you stool forms back up.  If your symptoms do not get better within 1 week or if they get worse, check with your doctor.  Dulcolax (bisacodyl) - Pick up over-the-counter and take as directed by the product packaging as needed to assist with the movement of your bowels.  Take with a full glass of water.  Use this product as needed if not relieved by Colace only.   MiraLax (polyethylene glycol) - Pick up over-the-counter to have on hand.  MiraLax is a solution that will increase the amount of water in your bowels to assist with bowel movements.  Take as directed and can mix with a glass of water, juice, soda, coffee, or tea.  Take if you go more than two days without a movement. Do not use MiraLax more than once per day. Call your doctor if you are still constipated or irregular after using this medication for 7 days in a row.  If you continue to have  problems with postoperative constipation, please contact the office for further assistance and recommendations.  If you experience "the worst abdominal pain ever" or develop nausea or vomiting, please contact the office immediatly for further recommendations for treatment.  ITCHING  If you experience itching with your medications, try taking only a single pain pill, or even half a pain pill at a time.  You can also use Benadryl over the counter for itching or also to help with sleep.   TED HOSE STOCKINGS Wear the elastic stockings on both legs for three weeks following surgery during the day but you may remove then at night for sleeping.  MEDICATIONS See your medication summary on the "After Visit Summary" that the nursing staff will review with you prior to discharge.  You may have some home medications which will be placed on hold until you complete the course of blood thinner medication.  It is important for you to complete the blood thinner medication as prescribed by your surgeon.  Continue your approved medications as instructed at time of discharge.  PRECAUTIONS If you experience chest pain or shortness of breath - call 911 immediately for transfer to the hospital emergency department.  If you develop a fever greater that 101 F, purulent drainage from wound, increased redness or drainage from wound, foul odor from the wound/dressing, or calf pain - CONTACT YOUR SURGEON.  FOLLOW-UP APPOINTMENTS Make sure you keep all of your appointments after your operation with your surgeon and caregivers. You should call the office at the above phone number and make an appointment for approximately two weeks after the date of your surgery or on the date instructed by your surgeon outlined in the "After Visit Summary".  RANGE OF MOTION AND STRENGTHENING EXERCISES  These exercises are designed to help you keep full movement of your hip joint. Follow your  caregiver's or physical therapist's instructions. Perform all exercises about fifteen times, three times per day or as directed. Exercise both hips, even if you have had only one joint replacement. These exercises can be done on a training (exercise) mat, on the floor, on a table or on a bed. Use whatever works the best and is most comfortable for you. Use music or television while you are exercising so that the exercises are a pleasant break in your day. This will make your life better with the exercises acting as a break in routine you can look forward to.  Lying on your back, slowly slide your foot toward your buttocks, raising your knee up off the floor. Then slowly slide your foot back down until your leg is straight again.  Lying on your back spread your legs as far apart as you can without causing discomfort.  Lying on your side, raise your upper leg and foot straight up from the floor as far as is comfortable. Slowly lower the leg and repeat.  Lying on your back, tighten up the muscle in the front of your thigh (quadriceps muscles). You can do this by keeping your leg straight and trying to raise your heel off the floor. This helps strengthen the largest muscle supporting your knee.  Lying on your back, tighten up the muscles of your buttocks both with the legs straight and with the knee bent at a comfortable angle while keeping your heel on the floor.   IF YOU ARE TRANSFERRED TO A SKILLED REHAB FACILITY If the patient is transferred to a skilled rehab facility following release from the hospital, a list of the current medications will be sent to the facility for the patient to continue.  When discharged from the skilled rehab facility, please have the facility set up the patient's [place] prior to being released. Also, the skilled facility will be responsible for providing the patient with their medications at time of release from the facility to include their pain medication,  the muscle relaxants, and their blood thinner medication. If the patient is still at the rehab facility at time of the two week follow up appointment, the skilled rehab facility will also need to assist the patient in arranging follow up appointment in our office and any transportation needs.  MAKE SURE YOU:  Understand these instructions.  Get help right away if you are not doing well or get worse.    Pick up stool softner and laxative for home use following surgery while on pain medications. Do not submerge incision under water. Please use good hand washing techniques while changing dressing each day. May shower starting three days after surgery. Please use a clean towel to pat the incision dry following showers. Continue to use ice for pain and swelling after surgery. Do not use any lotions or creams on the incision until instructed by your surgeon.   Do not sit on low chairs, stoools or toilet seats, as it may be difficult to get up from low  surfaces   Complete by:  As directed    Driving restrictions   Complete by:  As directed    No driving for two weeks   TED hose   Complete by:  As directed    Use stockings (TED hose) for three weeks on both leg(s).  You may remove them at night for sleeping.   Weight bearing as tolerated   Complete by:  As directed      Allergies as of 07/29/2018   No Known Allergies     Medication List    STOP taking these medications   meloxicam 15 MG tablet Commonly known as:  MOBIC     TAKE these medications   aspirin 325 MG EC tablet Take 1 tablet (325 mg total) by mouth 2 (two) times daily for 20 days. Take one tablet (325 mg) Aspirin two times a day for three weeks following surgery. Then take one baby Aspirin (81 mg) once a day for three weeks. Then discontinue aspirin.   AUSTRALIAN DREAM ARTHRITIS EX Apply 1 application topically 4 (four) times daily as needed (for pain.).   dapsone 25 MG tablet Take 25 mg by mouth daily.     HYDROcodone-acetaminophen 5-325 MG tablet Commonly known as:  NORCO/VICODIN Take 1-2 tablets by mouth every 6 (six) hours as needed for moderate pain (pain score 4-6).   levothyroxine 50 MCG tablet Commonly known as:  SYNTHROID, LEVOTHROID Take 50 mcg by mouth daily.   lidocaine 5 % ointment Commonly known as:  XYLOCAINE Apply 1-2 g topically 4 (four) times daily as needed for pain.   Magnesium 250 MG Tabs Take 250 mg by mouth daily.   methocarbamol 500 MG tablet Commonly known as:  ROBAXIN Take 1 tablet (500 mg total) by mouth every 6 (six) hours as needed for muscle spasms.   multivitamin with minerals Tabs tablet Take 1 tablet by mouth daily.   NONFORMULARY OR COMPOUNDED ITEM Apply 1-2 g topically 4 (four) times daily as needed for pain. C-Bacl-Gaba-Ibu-Pril-2%-2%-5%-2% Cr   SUPER B COMPLEX PO Take 1 capsule by mouth daily.   traMADol 50 MG tablet Commonly known as:  ULTRAM Take 1-2 tablets (50-100 mg total) by mouth every 6 (six) hours as needed for moderate pain (not relieved with oxycodone).   TYLENOL 8 HOUR ARTHRITIS PAIN 650 MG CR tablet Generic drug:  acetaminophen Take 650-1,300 mg by mouth every 8 (eight) hours as needed for pain.   vitamin C 1000 MG tablet Take 1,000 mg by mouth daily.   Vitamin D-3 5000 units Tabs Take 5,000 Units by mouth daily.            Discharge Care Instructions  (From admission, onward)         Start     Ordered   07/29/18 0000  Weight bearing as tolerated     07/29/18 0722   07/29/18 0000  Change dressing    Comments:  You may change your dressing on Wednesday (07/30/2018), then change the dressing daily with sterile 4 x 4 inch gauze dressing and paper tape.   07/29/18 6811         Follow-up Information    Gaynelle Arabian, MD. Schedule an appointment as soon as possible for a visit on 08/12/2018.   Specialty:  Orthopedic Surgery Contact information: [address] [place]  [address] [phone]           Signed: Theresa Duty, PA-C Orthopedic Surgery 07/31/2018, 8:56 AM

## 2019-05-09 IMAGING — DX DG PORTABLE PELVIS
1 series · 1 of 1 positions shown · non-contrast
Comparison: Intraoperative fluoroscopic images earlier today

CLINICAL DATA: Status post left hip arthroplasty.

EXAM:
PORTABLE PELVIS 1-2 VIEWS

[pelvis ap]
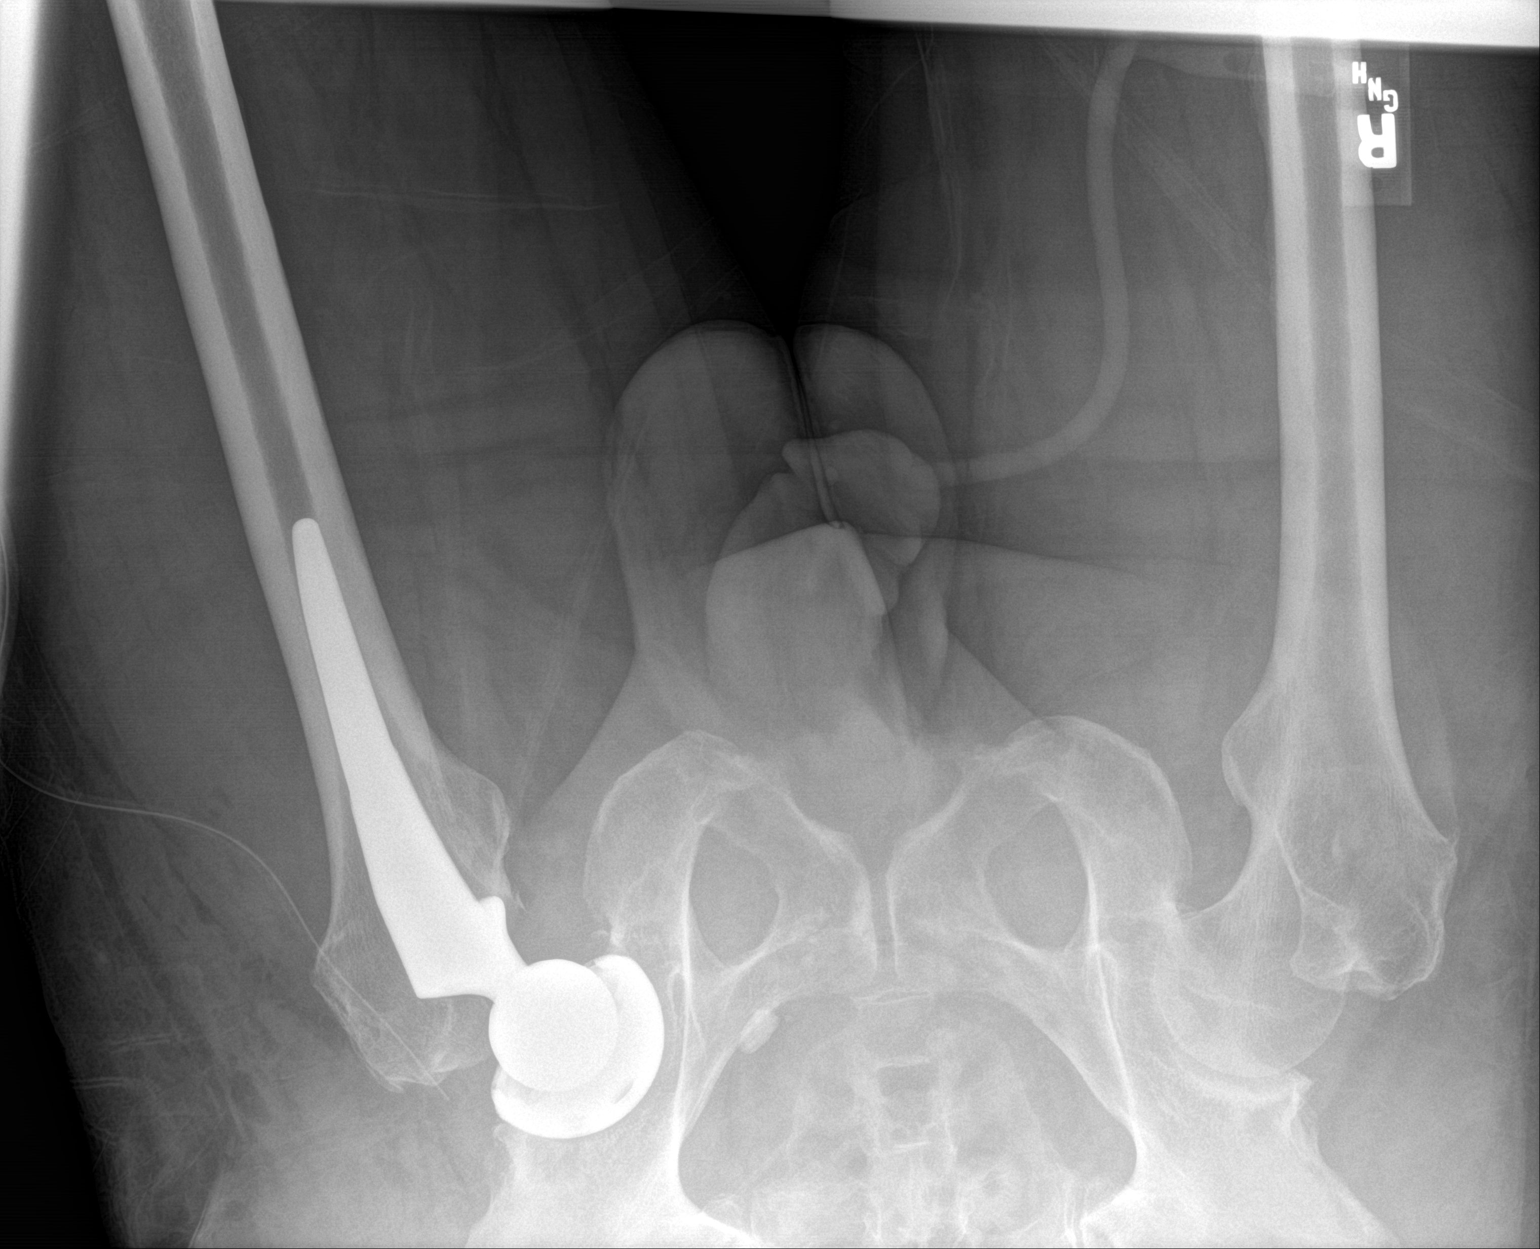

[1 of 1 positions shown; findings below may reference images not displayed]

FINDINGS: Sequelae of left total hip arthroplasty are identified. The
prosthetic femoral and acetabular components are approximated with
one another on this single AP image. A drain is in place, and there
is postoperative gas in the overlying soft tissues. No acute
fracture is identified. A Foley catheter is noted.
IMPRESSION: Left total hip arthroplasty without evidence of acute complication.

## 2021-05-01 ENCOUNTER — Encounter: Payer: Self-pay | Admitting: Cardiology

## 2021-05-01 NOTE — Progress Notes (Signed)
Cardiology Office Note  Date: 05/02/2021   ID: Troy Orozco, DOB 1956-11-11, MRN [id number]  PCP:  Selinda Flavin, MD  Cardiologist:  Nona Dell, MD Electrophysiologist:  None   Chief Complaint  Patient presents with  . Chest Pain    History of Present Illness: Troy Orozco is a 65 y.o. male referred for cardiology consultation by Mr. Leavy Cella PA-C with [facility] for the evaluation of chest pain.  He states that over the last several months he has been experiencing intermittent episodes of midsternal discomfort, not necessarily like indigestion, and not purely exertional.  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  Has not been as active walking in the last 6 months and admits that he has gained some weight.  I reviewed his medical history as noted below.  No personal history of type 2 diabetes mellitus, his LDL was 139 by lab work in April.  He does have a history of reflux and uses Protonix intermittently.  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  ECG from May 2022 done at [facility] shows normal sinus rhythm.  Follow-up tracing today shows sinus rhythm with borderline increased voltage although could be normal variant.  No history of hypertension.  He has not undergone any prior cardiac stress testing.  Past Medical History:  Diagnosis Date  . Anxiety   . Chronic idiopathic urticaria   . Foot pain, bilateral   . GERD (gastroesophageal reflux disease)   . Hammertoe of right foot   . Hypothyroidism   . Leukocytoclastic vasculitis (HCC)   . Low back pain     Past Surgical History:  Procedure Laterality Date  . COLONOSCOPY    . TOTAL HIP ARTHROPLASTY Left 07/28/2018   Procedure: LEFT TOTAL HIP ARTHROPLASTY ANTERIOR APPROACH;  Surgeon: Ollen Gross, MD;  Location: [facility];  Service: Orthopedics;  Laterality: Left;     Current Outpatient Medications  Medication Sig Dispense Refill  . acetaminophen (TYLENOL) 650  MG CR tablet Take 650-1,300 mg by mouth every 8 (eight) hours as needed for pain.    . Ascorbic Acid (VITAMIN C) 1000 MG tablet Take 1,000 mg by mouth daily.    . B Complex-C (SUPER B COMPLEX PO) Take 1 capsule by mouth daily.    . Cholecalciferol (VITAMIN D-3) 5000 units TABS Take 5,000 Units by mouth daily.    Marland Kitchen lidocaine (XYLOCAINE) 5 % ointment Apply 1-2 g topically 4 (four) times daily as needed for pain.  0  . Magnesium 250 MG TABS Take 250 mg by mouth daily.    . Multiple Vitamin (MULTIVITAMIN WITH MINERALS) TABS tablet Take 1 tablet by mouth daily.    . naproxen (NAPROSYN) 500 MG tablet Take 500 mg by mouth 2 (two) times daily with a meal.    . NONFORMULARY OR COMPOUNDED ITEM Apply 1-2 g topically 4 (four) times daily as needed for pain. C-Bacl-Gaba-Ibu-Pril-2%-2%-5%-2% Cr  99  . pantoprazole (PROTONIX) 40 MG tablet Take 40 mg by mouth daily.    . dapsone 25 MG tablet Take 25 mg by mouth daily.  (Patient not taking: Reported on 05/02/2021)  8  . Histamine Dihydrochloride (AUSTRALIAN DREAM ARTHRITIS EX) Apply 1 application topically 4 (four) times daily as needed (for pain.). (Patient not taking: Reported on 05/02/2021)    . HYDROcodone-acetaminophen (NORCO/VICODIN) 5-325 MG tablet Take 1-2 tablets by mouth every 6 (six) hours as needed for moderate pain (pain score 4-6). (Patient not taking: Reported on 05/02/2021)  56 tablet 0  . levothyroxine (SYNTHROID, LEVOTHROID) 50 MCG tablet Take 50 mcg by mouth daily. (Patient not taking: Reported on 05/02/2021)  2  . methocarbamol (ROBAXIN) 500 MG tablet Take 1 tablet (500 mg total) by mouth every 6 (six) hours as needed for muscle spasms. (Patient not taking: Reported on 05/02/2021) 40 tablet 0  . traMADol (ULTRAM) 50 MG tablet Take 1-2 tablets (50-100 mg total) by mouth every 6 (six) hours as needed for moderate pain (not relieved with oxycodone). (Patient not taking: Reported on 05/02/2021) 40 tablet 0   No current facility-administered medications for  this visit.   Allergies:  Patient has no known allergies.   Social History: The patient  reports that he has never smoked. He has never used smokeless tobacco. He reports that he does not drink alcohol and does not use drugs.   Family History: The patient's family history includes Narcolepsy in his father.   ROS: No syncope.  Physical Exam: VS:  BP (!) 142/90   Pulse 79   Ht 5\' 7"  (1.702 m)   Wt 205 lb 12.8 oz (93.4 kg)   SpO2 98%   BMI 32.23 kg/m , BMI Body mass index is 32.23 kg/m.  Wt Readings from Last 3 Encounters:  05/02/21 205 lb 12.8 oz (93.4 kg)  07/28/18 199 lb 15.7 oz (90.7 kg)  07/16/18 200 lb 4 oz (90.8 kg)    General: Patient appears comfortable at rest. HEENT: Conjunctiva and lids normal, wearing a mask. Neck: Supple, no elevated JVP or carotid bruits, no thyromegaly. Lungs: Clear to auscultation, nonlabored breathing at rest. Cardiac: Regular rate and rhythm, no S3 or significant systolic murmur, no pericardial rub. Abdomen: Bowel sounds present. Extremities: No pitting edema, distal pulses 2+. Skin: Warm and dry. Musculoskeletal: No kyphosis. Neuropsychiatric: Alert and oriented x3, affect grossly appropriate.  ECG:  No old tracings for review today.  Recent Labwork:  April 2022: Hemoglobin A1c 5.3%, BUN 16, creatinine 1.1, potassium 4.5 AST 28, ALT 19, TSH 4.93, cholesterol 215, triglycerides 145, HDL 50, LDL 139, hemoglobin 15.9, platelets 220  Other Studies Reviewed Today:  No previous cardiac testing for review.  Assessment and Plan:  1.  Recurrent chest discomfort in a 65 year old male.  Recent lab work showed hemoglobin A1c 5.3% and LDL 139.  ECG reviewed and nonspecific with borderline increased voltage, no history of hypertension.  Blood pressure today is mildly increased however.  Plan to pursue further ischemic work-up via exercise Myoview.  2.  GERD, uses Protonix intermittently.  Medication Adjustments/Labs and Tests Ordered: Current  medicines are reviewed at length with the patient today.  Concerns regarding medicines are outlined above.   Tests Ordered: Orders Placed This Encounter  Procedures  . NM Myocar Multi W/Spect W/Wall Motion / EF  . EKG 12-Lead    Medication Changes: No orders of the defined types were placed in this encounter.   Disposition:  Follow up test results.  Signed, 77, MD, [facility] 05/02/2021 9:35 AM    [facility] at [facility] [address] [place], [place], [place] Phone: [phone]; Fax: [phone]

## 2021-05-02 ENCOUNTER — Encounter (INDEPENDENT_AMBULATORY_CARE_PROVIDER_SITE_OTHER): Payer: Self-pay

## 2021-05-02 ENCOUNTER — Other Ambulatory Visit: Payer: Self-pay

## 2021-05-02 ENCOUNTER — Encounter: Payer: Self-pay | Admitting: *Deleted

## 2021-05-02 ENCOUNTER — Encounter: Payer: Self-pay | Admitting: Cardiology

## 2021-05-02 ENCOUNTER — Ambulatory Visit (INDEPENDENT_AMBULATORY_CARE_PROVIDER_SITE_OTHER): Payer: POS | Admitting: Cardiology

## 2021-05-02 VITALS — BP 142/90 | HR 79 | Ht 67.0 in | Wt 205.8 lb

## 2021-05-02 DIAGNOSIS — R079 Chest pain, unspecified: Secondary | ICD-10-CM | POA: Diagnosis not present

## 2021-05-02 DIAGNOSIS — K219 Gastro-esophageal reflux disease without esophagitis: Secondary | ICD-10-CM

## 2021-05-02 NOTE — Patient Instructions (Signed)
Your physician recommends that you schedule a follow-up appointment in: PENDING WITH DR MCDOWELL  Your physician recommends that you continue on your current medications as directed. Please refer to the Current Medication list given to you today.  Your physician has requested that you have en exercise stress myoview. For further information please visit [email or web address] Please follow instruction sheet, as given.  Thank you for choosing [facility]!!

## 2021-05-09 ENCOUNTER — Ambulatory Visit (HOSPITAL_COMMUNITY)
Admission: RE | Admit: 2021-05-09 | Discharge: 2021-05-09 | Disposition: A | Discharge status: Home / Self Care | Payer: POS | Source: Ambulatory Visit | Attending: Cardiology | Admitting: Cardiology

## 2021-05-09 ENCOUNTER — Encounter (HOSPITAL_COMMUNITY)
Admission: RE | Admit: 2021-05-09 | Discharge: 2021-05-09 | Disposition: A | Discharge status: Home / Self Care | Payer: POS | Source: Ambulatory Visit | Attending: Cardiology | Admitting: Cardiology

## 2021-05-09 ENCOUNTER — Encounter (HOSPITAL_COMMUNITY): Payer: Self-pay

## 2021-05-09 DIAGNOSIS — R079 Chest pain, unspecified: Secondary | ICD-10-CM

## 2021-05-09 LAB — NM MYOCAR MULTI W/SPECT W/WALL MOTION / EF
Estimated workload: 10.1 METS
Exercise duration (min): 7 min
Exercise duration (sec): 27 s
LV dias vol: 72 mL (ref 62–150)
LV sys vol: 20 mL
MPHR: 153 {beats}/min
Peak HR: 150 {beats}/min
Percent HR: 98 %
RATE: 0.32
RPE: 15
Rest HR: 71 {beats}/min
SDS: 1
SRS: 0
SSS: 1
TID: 0.93

## 2021-05-09 MED ORDER — TECHNETIUM TC 99M TETROFOSMIN IV KIT
30.0000 | PACK | Freq: Once | INTRAVENOUS | Status: AC | PRN
  Administered 2021-05-09: 31.7 via INTRAVENOUS

## 2021-05-09 MED ORDER — REGADENOSON 0.4 MG/5ML IV SOLN
INTRAVENOUS | Status: AC
  Filled 2021-05-09: qty 5

## 2021-05-09 MED ORDER — TECHNETIUM TC 99M TETROFOSMIN IV KIT
10.0000 | PACK | Freq: Once | INTRAVENOUS | Status: AC | PRN
  Administered 2021-05-09: 11 via INTRAVENOUS

## 2021-05-09 MED ORDER — SODIUM CHLORIDE FLUSH 0.9 % IV SOLN
INTRAVENOUS | Status: AC
  Administered 2021-05-09: 10 mL via INTRAVENOUS
  Filled 2021-05-09: qty 10

## 2021-05-12 ENCOUNTER — Telehealth: Payer: Self-pay | Admitting: *Deleted

## 2021-05-12 NOTE — Telephone Encounter (Signed)
-----   Message from Jonelle Sidle, MD sent at 05/09/2021  7:35 PM EDT ----- Results reviewed.  Please let him know that his stress test looked good, no evidence of ischemia to suggest obstructive CAD as cause of his symptoms and LVEF is normal.  Would focus on risk factor reduction, watch blood pressure, try to get back to regular walking plan.  Would not necessarily anticipate further cardiac testing unless his symptoms worsen.

## 2021-05-12 NOTE — Telephone Encounter (Signed)
Patient informed. Copy sent to PCP °

## 2022-09-17 ENCOUNTER — Ambulatory Visit (INDEPENDENT_AMBULATORY_CARE_PROVIDER_SITE_OTHER): Payer: POS | Admitting: Internal Medicine

## 2022-09-17 ENCOUNTER — Encounter: Payer: Self-pay | Admitting: Internal Medicine

## 2022-09-17 VITALS — BP 140/88 | HR 110 | Temp 97.6°F | Resp 18 | Ht 67.0 in | Wt 204.2 lb

## 2022-09-17 DIAGNOSIS — M310 Hypersensitivity angiitis: Secondary | ICD-10-CM | POA: Diagnosis not present

## 2022-09-17 NOTE — Progress Notes (Signed)
NEW PATIENT  Date of Service/Encounter:  09/17/22  Consult requested by: Selinda Flavin, MD   Subjective:   Troy Orozco (DOB: 17-Oct-1956) is a 66 y.o. male who presents to the clinic on 09/17/2022 with a chief complaint of Allergy Testing (Patient stated he took Benadryl last night for the hives and itching.) .    History obtained from: chart review and patient.   Reports about 5-6 years ago, he developed a rash starting with hives that then developed into dark red, painful and itchy rash of bilateral lower extremities.  He was seen by [facility]- Dr. Marland Mcalpine for leukocytoclastic vasculitis.  He was on Dapsone for about 2 years and had resolution of his rash. He was told this could reoccur or it might stay quiescent.  He can't recall if they got a biopsy or not.    In late August 2023, he redeveloped pink hives that are now starting to darken and leave discolorations.  He is taking OTC benadryl for the itching which helps but the rash is persistent.  He has gotten multiple courses of prednisone also but as soon as he stops it, they come back.  They are on his trunk and legs.    Chart review shows his initial visit was in 2016 with [facility] after he had tried maximum anti histamines without resolution of his hives and underwent biopsy with Dr. Jorja Loa dermatology that showed leukocytoclastic vasculitis of L shin.  He was then referred to [facility] for further care and started on Dapsone.     Past Medical History: Past Medical History:  Diagnosis Date   Anxiety    Chronic idiopathic urticaria    Foot pain, bilateral    GERD (gastroesophageal reflux disease)    Hammertoe of right foot    Hypothyroidism    Leukocytoclastic vasculitis (HCC)    Low back pain    Past Surgical History: Past Surgical History:  Procedure Laterality Date   COLONOSCOPY     TOTAL HIP ARTHROPLASTY Left 07/28/2018   Procedure: LEFT TOTAL HIP ARTHROPLASTY ANTERIOR APPROACH;  Surgeon: Ollen Gross, MD;   Location: [facility];  Service: Orthopedics;  Laterality: Left;     Family History: Family History  Problem Relation Age of Onset   Narcolepsy Father     Social History:  Lives in a 30 year house Flooring in bedroom: carpet Pets: cat Tobacco use/exposure: none Job: [employer]  Medication List:  Allergies as of 09/17/2022   No Known Allergies      Medication List        Accurate as of September 17, 2022 12:23 PM. If you have any questions, ask your nurse or doctor.          STOP taking these medications    AUSTRALIAN DREAM ARTHRITIS EX Stopped by: Birder Robson, MD   dapsone 25 MG tablet Stopped by: Birder Robson, MD   HYDROcodone-acetaminophen 5-325 MG tablet Commonly known as: NORCO/VICODIN Stopped by: Birder Robson, MD   levothyroxine 50 MCG tablet Commonly known as: SYNTHROID Stopped by: Birder Robson, MD   methocarbamol 500 MG tablet Commonly known as: ROBAXIN Stopped by: Birder Robson, MD   pantoprazole 40 MG tablet Commonly known as: PROTONIX Stopped by: Birder Robson, MD   traMADol 50 MG tablet Commonly known as: ULTRAM Stopped by: Birder Robson, MD       TAKE these medications    acetaminophen 650 MG CR tablet Commonly known as: TYLENOL Take  650-1,300 mg by mouth every 8 (eight) hours as needed for pain.   lidocaine 5 % ointment Commonly known as: XYLOCAINE Apply 1-2 g topically 4 (four) times daily as needed for pain.   Magnesium 250 MG Tabs Take 250 mg by mouth daily.   multivitamin with minerals Tabs tablet Take 1 tablet by mouth daily.   naproxen 500 MG tablet Commonly known as: NAPROSYN Take 500 mg by mouth 2 (two) times daily with a meal.   NONFORMULARY OR COMPOUNDED ITEM Apply 1-2 g topically 4 (four) times daily as needed for pain. C-Bacl-Gaba-Ibu-Pril-2%-2%-5%-2% Cr   SUPER B COMPLEX PO Take 1 capsule by mouth daily.   vitamin C 1000 MG tablet Take 1,000 mg by mouth daily.   Vitamin D-3 125 MCG (5000 UT)  Tabs Take 5,000 Units by mouth daily.         REVIEW OF SYSTEMS: Pertinent positives and negatives discussed in HPI.   Objective:   Physical Exam: BP (!) 140/88   Pulse (!) 110   Temp 97.6 F (36.4 C)   Resp 18   Ht 5\' 7"  (1.702 m)   Wt 204 lb 3.2 oz (92.6 kg)   SpO2 97%   BMI 31.98 kg/m  Body mass index is 31.98 kg/m. GEN: alert, well developed HEENT: clear conjunctiva, TM grey and translucent, nose with mild inferior turbinate hypertrophy, pink nasal mucosa, no rhinorrhea, no cobblestoning HEART: regular rate and rhythm, no murmur LUNGS: clear to auscultation bilaterally, no coughing, unlabored respiration ABDOMEN: soft, non distended  SKIN: pink hives diffusely on lower extremities and trunk, some with residual hyperpigmentation  Reviewed:  Notes from prior visits at [facility] See HPI   Assessment:   1. Leukocytoclastic vasculitis (HCC)     Plan/Recommendations:   Leukocytoclastic vasculitis  - Rash does not look or sound like typical idiopathic urticaria. This is likely recurrence of leukocytoclastic vasculitis.  Discussed possible options of seeing Rheumatology/Dermatology in [place] vs going back to [facility].  They would prefer to go back to [facility] as they have cared for him prior. I do not believe aeroallergen- allergy testing would be useful for this as that is not the trigger for this rash.   - Start Allegra (Fexofenadine) 360mg  twice daily instead of benadryl.   - You had leukocytoclastic vasculitis in the past.  You likely need a repeat biopsy of the skin and appropriate treatment.  We will refer you back to [facility] Rheumatology, Allergy and Immunology.  Return if symptoms worsen or fail to improve.  Harlon Flor, MD [facility]

## 2022-09-17 NOTE — Patient Instructions (Addendum)
-   Start Allegra (Fexofenadine) 360mg  twice daily.   - You had leukocytoclastic vasculitis in the past.  You likely need a repeat biopsy of the skin and appropriate treatment.  We will refer you back to [facility] Rheumatology, Allergy and Immunology.

## 2022-09-24 ENCOUNTER — Telehealth: Payer: Self-pay

## 2022-09-24 NOTE — Telephone Encounter (Signed)
-----   Message from Larose Kells, MD sent at 09/17/2022 11:44 AM EDT ----- Troy Orozco.  This is the patient that needs to be seen by [facility] Rheumatology/Immunology.  Previously saw Dr. Alfredia Ferguson so if possible would like to see her again.

## 2022-09-24 NOTE — Telephone Encounter (Signed)
Patient now called back asking what he should do regarding the hives all over his face and swelling. Patient is wanting to know if he should take more Allegra or can he combine the benadryl in the morning with Allegra. 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 Patient did bring up prednisone but stated he didn't think Dr Posey Pronto wanted him to take it.

## 2022-09-24 NOTE — Telephone Encounter (Addendum)
Patient called to check in on his referral. I did let him know I am a little behind with outgoing referrals. Patient went into detail stating he is really having a hard time with the hives/swelling as it is all over his face and eyelids making it hard for him to see. I did ask the patient if he would either like me to send a message to Dr Posey Pronto or schedule a televisit for today , but he stated he will call his PCP as they have helped him in the past. Patient states he is doing the allegra as Dr Posey Pronto stated.   I went ahead and placed his referral to Dr Alfredia Ferguson. Their office wouldn't allow me to schedule as their policy is to review the referral and then contact the patient to get scheduled.    I called and left a voicemail to update the patient regarding the referral being placed.   [place] at [facility] (Rheumatology) [address] [place], [place] [address] Phone: [phone] Fax# [phone]

## 2022-09-25 ENCOUNTER — Other Ambulatory Visit: Payer: Self-pay | Admitting: Internal Medicine

## 2022-09-25 MED ORDER — FAMOTIDINE 40 MG PO TABS: 40.0000 mg | ORAL_TABLET | Freq: Two times a day (BID) | ORAL | 1 refills | Status: AC

## 2022-09-25 MED ORDER — HYDROXYZINE HCL 10 MG PO TABS: 10.0000 mg | ORAL_TABLET | Freq: Three times a day (TID) | ORAL | 0 refills | Status: AC | PRN

## 2022-09-25 MED ORDER — FEXOFENADINE HCL 180 MG PO TABS: 360.0000 mg | ORAL_TABLET | Freq: Two times a day (BID) | ORAL | 1 refills | Status: AC

## 2022-09-25 NOTE — Telephone Encounter (Signed)
6 hours ago (8:45 AM)   Troy Patel,MD  [name],   Thanks for getting the referral in. Have him call them also to help speed up the process and to let them know that the referral is already placed and he was a prior patient of Dr. Shelton Silvas.   In terms of the hives, I want him to take Allegra 360mg  twice daily and Pepcid 40mg  twice daily.  He can also use hydroxyzine 10mg  as needed but this will make him sleepy.  So I want him to use it before bedtime to help him rest.       Can a nurse please call the patient to discuss the change in medication regimen for his Hives?  Thanks

## 2022-09-25 NOTE — Telephone Encounter (Signed)
Spoke to wife and told her to have him call the office back so that he can be advised of the change in medication regimen. Wife stated that she would tell him to call.

## 2022-09-25 NOTE — Progress Notes (Signed)
Still having uncontrolled hives. Informed to increase to Allegra 360mg  BID, Pepcid 40mg  BID and Hydroxyzine 10mg  PRN (can make him sleepy so recommend taking it around bedtime).

## 2022-09-27 NOTE — Telephone Encounter (Signed)
Patient called back and spoke for about 9 mins. Patient is scheduled for 10/19/22 for the Rheumatologist. Patient states he didn't know we were sending in meds that were OTC to the pharmacy. He stated that he got them cheaper at the store.... Patient states he feels like our office is just passing him off... But he has started the meds and doing better... In a nut shell patient is doing better and will go to the Rheumatologist.   Thanks

## 2022-10-01 NOTE — Telephone Encounter (Signed)
Patient stopped by and dropped off FMLA form. I did not collect the $25 payment until the provider approves doing the FMLA form.

## 2022-10-01 NOTE — Telephone Encounter (Signed)
Called patient and informed him that his FMLA forms are ready for pickup. I also informed him to bring $25 to pay for forms. I let him know the hours of the [place] office.

## 2022-10-05 NOTE — Telephone Encounter (Signed)
Patient stopped by and picked up forms

## 2023-04-11 ENCOUNTER — Other Ambulatory Visit: Payer: Self-pay

## 2023-04-11 DIAGNOSIS — R600 Localized edema: Secondary | ICD-10-CM

## 2023-04-12 ENCOUNTER — Ambulatory Visit (HOSPITAL_COMMUNITY)
Admission: RE | Admit: 2023-04-12 | Discharge: 2023-04-12 | Disposition: A | Discharge status: Home / Self Care | Payer: 59 | Source: Ambulatory Visit | Attending: Physician Assistant | Admitting: Physician Assistant

## 2023-04-12 DIAGNOSIS — R600 Localized edema: Secondary | ICD-10-CM

## 2024-01-09 ENCOUNTER — Other Ambulatory Visit: Payer: Self-pay

## 2024-01-09 ENCOUNTER — Emergency Department (HOSPITAL_COMMUNITY)
Admission: EM | Admit: 2024-01-09 | Discharge: 2024-01-09 | Disposition: A | Discharge status: Home / Self Care | Payer: Commercial Managed Care - PPO | Attending: Emergency Medicine | Admitting: Emergency Medicine

## 2024-01-09 ENCOUNTER — Emergency Department (HOSPITAL_COMMUNITY): Payer: Commercial Managed Care - PPO

## 2024-01-09 DIAGNOSIS — M546 Pain in thoracic spine: Secondary | ICD-10-CM | POA: Insufficient documentation

## 2024-01-09 DIAGNOSIS — M62830 Muscle spasm of back: Secondary | ICD-10-CM

## 2024-01-09 LAB — CBC WITH DIFFERENTIAL/PLATELET
Abs Immature Granulocytes: 0.03 10*3/uL (ref 0.00–0.07)
Basophils Absolute: 0.1 10*3/uL (ref 0.0–0.1)
Basophils Relative: 1 %
Eosinophils Absolute: 0.2 10*3/uL (ref 0.0–0.5)
Eosinophils Relative: 3 %
HCT: 37.4 % — ABNORMAL LOW (ref 39.0–52.0)
Hemoglobin: 11.9 g/dL — ABNORMAL LOW (ref 13.0–17.0)
Immature Granulocytes: 1 %
Lymphocytes Relative: 27 %
Lymphs Abs: 1.7 10*3/uL (ref 0.7–4.0)
MCH: 35.3 pg — ABNORMAL HIGH (ref 26.0–34.0)
MCHC: 31.8 g/dL (ref 30.0–36.0)
MCV: 111 fL — ABNORMAL HIGH (ref 80.0–100.0)
Monocytes Absolute: 0.6 10*3/uL (ref 0.1–1.0)
Monocytes Relative: 9 %
Neutro Abs: 3.9 10*3/uL (ref 1.7–7.7)
Neutrophils Relative %: 59 %
Platelets: 188 10*3/uL (ref 150–400)
RBC: 3.37 MIL/uL — ABNORMAL LOW (ref 4.22–5.81)
RDW: 14.6 % (ref 11.5–15.5)
WBC: 6.5 10*3/uL (ref 4.0–10.5)
nRBC: 0 % (ref 0.0–0.2)

## 2024-01-09 LAB — COMPREHENSIVE METABOLIC PANEL
ALT: 21 U/L (ref 0–44)
AST: 31 U/L (ref 15–41)
Albumin: 4.3 g/dL (ref 3.5–5.0)
Alkaline Phosphatase: 95 U/L (ref 38–126)
Anion gap: 11 (ref 5–15)
BUN: 23 mg/dL (ref 8–23)
CO2: 25 mmol/L (ref 22–32)
Calcium: 9.3 mg/dL (ref 8.9–10.3)
Chloride: 102 mmol/L (ref 98–111)
Creatinine, Ser: 0.96 mg/dL (ref 0.61–1.24)
GFR, Estimated: >60 mL/min (ref >60)
Glucose, Bld: 110 mg/dL — ABNORMAL HIGH (ref 70–99)
Potassium: 3.7 mmol/L (ref 3.5–5.1)
Sodium: 138 mmol/L (ref 135–145)
Total Bilirubin: 0.9 mg/dL (ref 0.0–1.2)
Total Protein: 6.7 g/dL (ref 6.5–8.1)

## 2024-01-09 LAB — TROPONIN I (HIGH SENSITIVITY)
Troponin I (High Sensitivity): 5 ng/L (ref <18)
Troponin I (High Sensitivity): 6 ng/L (ref <18)

## 2024-01-09 LAB — D-DIMER, QUANTITATIVE: D-Dimer, Quant: 1.13 ug{FEU}/mL — ABNORMAL HIGH (ref 0.00–0.50)

## 2024-01-09 MED ORDER — LIDOCAINE 5 % EX PTCH
1.0000 | MEDICATED_PATCH | CUTANEOUS | Status: DC
  Administered 2024-01-09: 1 via TRANSDERMAL
  Filled 2024-01-09: qty 1

## 2024-01-09 MED ORDER — IOHEXOL 350 MG/ML SOLN
100.0000 mL | Freq: Once | INTRAVENOUS | Status: AC | PRN
  Administered 2024-01-09: 100 mL via INTRAVENOUS

## 2024-01-09 MED ORDER — KETOROLAC TROMETHAMINE 15 MG/ML IJ SOLN
15.0000 mg | Freq: Once | INTRAMUSCULAR | Status: AC
  Administered 2024-01-09: 15 mg via INTRAVENOUS
  Filled 2024-01-09: qty 1

## 2024-01-09 MED ORDER — METHOCARBAMOL 500 MG PO TABS: 500.0000 mg | ORAL_TABLET | Freq: Two times a day (BID) | ORAL | 0 refills | Status: AC | PRN

## 2024-01-09 NOTE — ED Provider Notes (Signed)
 [place] EMERGENCY DEPARTMENT AT [facility] Provider Note   CSN: [id number] Arrival date & time: 01/09/24  0510     History  Chief Complaint  Patient presents with   Back Pain    Troy Orozco is a 68 y.o. male.  68 yo M with a chief complaint of left upper back pain.  Started while he was eating lunch while working an overnight shift.  He started feeling a bit woozy and felt like he was having trouble catching his breath.  Nothing seem to make it better or worse.  He ended up taking some ibuprofen and now feels quite a bit better.  He denies any obvious injury.  Denies cough congestion or fever.  Was quite well before this happens.   Back Pain      Home Medications Prior to Admission medications   Medication Sig Start Date End Date Taking? Authorizing Provider  acetaminophen  (TYLENOL ) 650 MG CR tablet Take 650-1,300 mg by mouth every 8 (eight) hours as needed for pain.    [provider]  Ascorbic Acid (VITAMIN C) 1000 MG tablet Take 1,000 mg by mouth daily.    [provider]  B Complex-C (SUPER B COMPLEX PO) Take 1 capsule by mouth daily.    [provider]  Cholecalciferol (VITAMIN D-3) 5000 units TABS Take 5,000 Units by mouth daily.    [provider]  famotidine  (PEPCID ) 40 MG tablet Take 1 tablet (40 mg total) by mouth 2 (two) times daily. 09/25/22   Tobie Arleta SQUIBB, MD  fexofenadine  (ALLEGRA  ALLERGY) 180 MG tablet Take 2 tablets (360 mg total) by mouth in the morning and at bedtime. 09/25/22   Tobie Arleta SQUIBB, MD  hydrOXYzine  (ATARAX ) 10 MG tablet Take 1 tablet (10 mg total) by mouth 3 (three) times daily as needed (for hives). 09/25/22   Tobie Arleta SQUIBB, MD  lidocaine  (XYLOCAINE ) 5 % ointment Apply 1-2 g topically 4 (four) times daily as needed for pain. 07/09/18   [provider]  Magnesium 250 MG TABS Take 250 mg by mouth daily.    [provider]  Multiple Vitamin (MULTIVITAMIN WITH MINERALS) TABS tablet  Take 1 tablet by mouth daily.    [provider]  naproxen (NAPROSYN) 500 MG tablet Take 500 mg by mouth 2 (two) times daily with a meal.    [provider]  NONFORMULARY OR COMPOUNDED ITEM Apply 1-2 g topically 4 (four) times daily as needed for pain. C-Bacl-Gaba-Ibu-Pril-2%-2%-5%-2% Cr 07/09/18   [provider]      Allergies    Patient has no known allergies.    Review of Systems   Review of Systems  Musculoskeletal:  Positive for back pain.    Physical Exam Updated Vital Signs BP (!) 158/90 (BP Location: Left Arm)   Pulse 84   Temp 97.6 F (36.4 C) (Oral)   Resp 20   Ht 5' 7 (1.702 m)   Wt 93 kg   SpO2 94%   BMI 32.11 kg/m  Physical Exam Vitals and nursing note reviewed.  Constitutional:      Appearance: He is well-developed.  HENT:     Head: Normocephalic and atraumatic.  Eyes:     Pupils: Pupils are equal, round, and reactive to light.  Neck:     Vascular: No JVD.  Cardiovascular:     Rate and Rhythm: Normal rate and regular rhythm.     Heart sounds: No murmur heard.    No friction rub.  No gallop.  Pulmonary:     Effort: No respiratory distress.     Breath sounds: No wheezing.  Abdominal:     General: There is no distension.     Tenderness: There is no abdominal tenderness. There is no guarding or rebound.  Musculoskeletal:        General: Tenderness present. Normal range of motion.     Cervical back: Normal range of motion and neck supple.     Comments: Pain to the left upper back, between the scapula and the thoracic spine worse about T4 and 5.  Reproduces his discomfort.  Skin:    Coloration: Skin is not pale.     Findings: No rash.  Neurological:     Mental Status: He is alert and oriented to person, place, and time.  Psychiatric:        Behavior: Behavior normal.     ED Results / Procedures / Treatments   Labs (all labs ordered are listed, but only abnormal results are displayed) Labs Reviewed  CBC WITH  DIFFERENTIAL/PLATELET - Abnormal; Notable for the following components:      Result Value   RBC 3.37 (*)    Hemoglobin 11.9 (*)    HCT 37.4 (*)    MCV 111.0 (*)    MCH 35.3 (*)    All other components within normal limits  COMPREHENSIVE METABOLIC PANEL - Abnormal; Notable for the following components:   Glucose, Bld 110 (*)    All other components within normal limits  D-DIMER, QUANTITATIVE - Abnormal; Notable for the following components:   D-Dimer, Quant 1.13 (*)    All other components within normal limits  TROPONIN I (HIGH SENSITIVITY)  TROPONIN I (HIGH SENSITIVITY)    EKG None  Radiology No results found.  Procedures Procedures    Medications Ordered in ED Medications  iohexol  (OMNIPAQUE ) 350 MG/ML injection 100 mL (100 mLs Intravenous Contrast Given 01/09/24 0657)    ED Course/ Medical Decision Making/ A&P                                 Medical Decision Making Amount and/or Complexity of Data Reviewed Labs: ordered. Radiology: ordered. ECG/medicine tests: ordered.  Risk Prescription drug management.   68 yo M with a chief complaint of left upper back pain.  Most likely musculoskeletal by history and physical exam.  He is quite tachypneic in the room however, oxygen saturation is on the lower aspect of normal.  Will obtain lab work including a D-dimer.  Chest x-ray.  D-dimer is quite elevated.  Will obtain CT imaging of the chest.  With it being sudden and severe to the middle of the back will obtain a dissection study.  Awaiting CT and repeat trop.  Signed out to Dr. Doretha, please see their note for further details of care in the ED.  The patients results and plan were reviewed and discussed.   Any x-rays performed were independently reviewed by myself.   Differential diagnosis were considered with the presenting HPI.  Medications  iohexol  (OMNIPAQUE ) 350 MG/ML injection 100 mL (100 mLs Intravenous Contrast Given 01/09/24 0657)    Vitals:    01/09/24 0517 01/09/24 0518 01/09/24 0519  BP: (!) 158/90 (!) 158/90   Pulse: 87 84   Resp: 20 20   Temp: 97.8 F (36.6 C) 97.6 F (36.4 C)   TempSrc: Oral Oral   SpO2: 96% 94%   Weight:  93 kg  Height:   5' 7 (1.702 m)    Final diagnoses:  Acute left-sided thoracic back pain           Final Clinical Impression(s) / ED Diagnoses Final diagnoses:  Acute left-sided thoracic back pain    Rx / DC Orders ED Discharge Orders     None         Emil Share, DO 01/09/24 9278

## 2024-01-09 NOTE — ED Triage Notes (Signed)
 Pt arrives via EMS from work this morning after episode of 10/10 pain between shoulder blades making is difficult to breathe. Pt states pain now 4/10. Denies any cardiac hx.

## 2024-01-09 NOTE — ED Provider Notes (Signed)
 Assumed care from Dr. Emil at 7 AM.  Patient here with sudden onset of thoracic back pain that goes into his left arm.  Patient's CTA of his chest was negative for acute pathology.  Labs are reassuring.  On repeat evaluation patient reports initially with ibuprofen the pain had improved but that was over 6 hours ago and now the pain has come back.  It is reproducible by and looking down into the right also with palpation over the scapular and trapezius area.  Pulses intact and suspect musculoskeletal pain.   Doretha Folks, MD 01/09/24 [phone]

## 2024-01-09 NOTE — ED Notes (Signed)
 Pt states back pain between shoulder blades that radiates towards left arm EDP notified

## 2024-01-09 NOTE — Discharge Instructions (Addendum)
 You can take your next dose of ibuprofen this evening at 6 PM.  You can also take extra strength Tylenol  every 6 hours.  You can try ice or heat what ever helps and use the muscle relaxer as needed.  Avoid any heavy lifting.  If you got a new pillow or anything different that you would have slept on go back to what you had been using.

## 2024-08-16 NOTE — Progress Notes (Unsigned)
 [facility]  INITIAL CONSULT NOTE  Patient Care Team: Kayla Drivers, MD as PCP - General (Family Medicine) Debera Jayson MATSU, MD as PCP - Cardiology (Cardiology)  Hematological/Oncological History Labs from [facility]: 01/09/2024: WBC 6.5, Hgb 11.9 (L), MCV 111 (H), Plt 188 06/27/2024: Iron 125, TIBC 278, ferritin 307, iron saturation 45%, folate >20, Vitamin b12 1025, Wbc 6.4, Hgb 11.8 (L), MCV 110 (H), 216,  Labs from [facility] immunologist: WBC 6.8, Hgb 12.3 (L), MCV 101.6 (H), Plt 200, Creatinine 0.90, LFTs normal, CRP and sed rate normal, SPEP/IFE did not detect M protein but demonstrated an irregularity int he gamma region which may represent M protein. Kappa light chain 2.32 (H), lambda light chain 1.87, ratio 1/24.  08/17/2024: Establish care with [facility]  CHIEF COMPLAINTS/PURPOSE OF CONSULTATION:  Macrocytic anemia  HISTORY OF PRESENTING ILLNESS:  Troy Orozco 68 y.o. 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 He is accompanied by his wife for this visit.   On exam today, Mr. Brunkhorst reports experiencing decreased energy levels, at 25% of his baseline. He adds that his poor sleep habits are impacting his energy levels. Due to nocturia, he requires frequent interruption from sleep. He has a good appetite and weight is overalls stable. He denies nausea, vomiting or bowel habit changes. He denies easy bruising or signs of bleeding. He reports shortness of breath with exertion but none at rest. He has ongoing knee and shoulder pains that he rates 4 out of 10 on a pain scale. He denies fevers, chills, sweats, chest pain, cough, headaches or dizziness. He has no other complaints. Rest of the ROS is below.   MEDICAL HISTORY:  Past Medical History:  Diagnosis Date   Anxiety    Chronic idiopathic urticaria     Foot pain, bilateral    GERD (gastroesophageal reflux disease)    Hammertoe of right foot    Hypothyroidism    Leukocytoclastic vasculitis (HCC)    Low back pain     SURGICAL HISTORY: Past Surgical History:  Procedure Laterality Date   COLONOSCOPY     TOTAL HIP ARTHROPLASTY Left 07/28/2018   Procedure: LEFT TOTAL HIP ARTHROPLASTY ANTERIOR APPROACH;  Surgeon: Melodi Lerner, MD;  Location: [facility];  Service: Orthopedics;  Laterality: Left;     SOCIAL HISTORY: Social History   Socioeconomic History   Marital status: Married    Spouse name: Not on file   Number of children: Not on file   Years of education: Not on file   Highest education level: Not on file  Occupational History   Not on file  Tobacco Use   Smoking status: Never   Smokeless tobacco: Never  Vaping Use   Vaping status: Never Used  Substance and Sexual Activity   Alcohol use: Never   Drug use: Never   Sexual activity: Not on file  Other Topics Concern   Not on file  Social History Narrative   Not on file   Social Drivers of Health   Financial Resource Strain: Not on file  Food Insecurity: No Food Insecurity (08/17/2024)   Hunger Vital Sign    Worried About Running Out of Food in the Last Year: Never true    Ran Out of Food in the Last Year: Never true  Transportation Needs: No Transportation Needs (08/17/2024)   PRAPARE - Administrator, Civil Service (Medical): No  Lack of Transportation (Non-Medical): No  Physical Activity: Not on file  Stress: Not on file  Social Connections: Not on file  Intimate Partner Violence: Not At Risk (08/17/2024)   Humiliation, Afraid, Rape, and Kick questionnaire    Fear of Current or Ex-Partner: No    Emotionally Abused: No    Physically Abused: No    Sexually Abused: No    FAMILY HISTORY: Family History  Problem Relation Age of Onset   Breast cancer Mother        diagnosed > 50 years   Narcolepsy Father     ALLERGIES:  has no known  allergies.  MEDICATIONS:  Current Outpatient Medications  Medication Sig Dispense Refill   acetaminophen  (TYLENOL ) 650 MG CR tablet Take 650-1,300 mg by mouth every 8 (eight) hours as needed for pain.     Ascorbic Acid (VITAMIN C) 1000 MG tablet Take 1,000 mg by mouth daily.     B Complex-C (SUPER B COMPLEX PO) Take 1 capsule by mouth daily.     Cholecalciferol (VITAMIN D-3) 5000 units TABS Take 5,000 Units by mouth daily.     dapsone 100 MG tablet Take 100 mg by mouth daily.     famotidine  (PEPCID ) 40 MG tablet Take 1 tablet (40 mg total) by mouth 2 (two) times daily. 60 tablet 1   fexofenadine  (ALLEGRA  ALLERGY) 180 MG tablet Take 2 tablets (360 mg total) by mouth in the morning and at bedtime. 120 tablet 1   hydrOXYzine  (ATARAX ) 10 MG tablet Take 1 tablet (10 mg total) by mouth 3 (three) times daily as needed (for hives). 60 tablet 0   lidocaine  (XYLOCAINE ) 5 % ointment Apply 1-2 g topically 4 (four) times daily as needed for pain.  0   Magnesium 250 MG TABS Take 250 mg by mouth daily.     methocarbamol  (ROBAXIN ) 500 MG tablet Take 1 tablet (500 mg total) by mouth 2 (two) times daily as needed for muscle spasms. 20 tablet 0   montelukast (SINGULAIR) 10 MG tablet Take 10 mg by mouth at bedtime.     Multiple Vitamin (MULTIVITAMIN WITH MINERALS) TABS tablet Take 1 tablet by mouth daily.     naproxen (NAPROSYN) 500 MG tablet Take 500 mg by mouth 2 (two) times daily with a meal.     NONFORMULARY OR COMPOUNDED ITEM Apply 1-2 g topically 4 (four) times daily as needed for pain. C-Bacl-Gaba-Ibu-Pril-2%-2%-5%-2% Cr  99   oseltamivir (TAMIFLU) 75 MG capsule Take 75 mg by mouth 2 (two) times daily.     predniSONE (DELTASONE) 20 MG tablet Take by mouth.     sertraline (ZOLOFT) 50 MG tablet Take 50 mg by mouth daily.     XOLAIR 150 MG/ML prefilled syringe Inject 300 mg into the skin every 14 (fourteen) days.     No current facility-administered medications for this visit.    REVIEW OF SYSTEMS:    Constitutional: ( - ) fevers, ( - )  chills , ( - ) night sweats Eyes: ( - ) blurriness of vision, ( - ) double vision, ( - ) watery eyes Ears, nose, mouth, throat, and face: ( - ) mucositis, ( - ) sore throat Respiratory: ( - ) cough, ( +) dyspnea, ( - ) wheezes Cardiovascular: ( - ) palpitation, ( - ) chest discomfort, ( + ) lower extremity swelling Gastrointestinal:  ( - ) nausea, ( - ) heartburn, ( - ) change in bowel habits Skin: ( - ) abnormal skin rashes Lymphatics: ( - )  new lymphadenopathy, ( - ) easy bruising Neurological: ( - ) numbness, ( - ) tingling, ( - ) new weaknesses Behavioral/Psych: ( - ) mood change, ( - ) new changes  All other systems were reviewed with the patient and are negative.  PHYSICAL EXAMINATION: ECOG PERFORMANCE STATUS: 1 - Symptomatic but completely ambulatory  Vitals:   08/17/24 0911  BP: 138/85  Pulse: 82  Resp: 18  Temp: 98.3 F (36.8 C)  SpO2: 97%   Filed Weights   08/17/24 0911  Weight: 201 lb 4.5 oz (91.3 kg)    GENERAL: well appearing male in NAD  SKIN: skin color, texture, turgor are normal, no rashes or significant lesions EYES: conjunctiva are pink and non-injected, sclera clear OROPHARYNX: no exudate, no erythema; lips, buccal mucosa, and tongue normal  LYMPH:  no palpable lymphadenopathy in the cervical or supraclavicular lymph nodes.  LUNGS: clear to auscultation and percussion with normal breathing effort HEART: regular rate & rhythm and no murmurs.Left greater than right ankle edema.  ABDOMEN: soft, non-tender, non-distended, normal bowel sounds Musculoskeletal: no cyanosis of digits and no clubbing  PSYCH: alert & oriented x 3, fluent speech NEURO: no focal motor/sensory deficits  LABORATORY DATA:  I have reviewed the data as listed    Latest Ref Rng & Units 08/17/2024   10:11 AM 01/09/2024    5:26 AM 07/29/2018    4:09 AM  CBC  WBC 4.0 - 10.5 K/uL 3.8  6.5  10.8   Hemoglobin 13.0 - 17.0 g/dL 86.6  88.0  87.6    Hematocrit 39.0 - 52.0 % 40.5  37.4  36.0   Platelets 150 - 400 K/uL 203  188  190        Latest Ref Rng & Units 08/17/2024   10:11 AM 01/09/2024    5:26 AM 07/29/2018    4:09 AM  CMP  Glucose 70 - 99 mg/dL 97  889  859   BUN 8 - 23 mg/dL 15  23  15    Creatinine 0.61 - 1.24 mg/dL 8.96  9.03  9.07   Sodium 135 - 145 mmol/L 140  138  139   Potassium 3.5 - 5.1 mmol/L 4.2  3.7  4.0   Chloride 98 - 111 mmol/L 108  102  108   CO2 22 - 32 mmol/L 25  25  24    Calcium 8.9 - 10.3 mg/dL 9.1  9.3  8.7   Total Protein 6.5 - 8.1 g/dL 7.0  6.7    Total Bilirubin 0.0 - 1.2 mg/dL 0.7  0.9    Alkaline Phos 38 - 126 U/L 85  95    AST 15 - 41 U/L 26  31    ALT 0 - 44 U/L 16  21     RADIOGRAPHIC STUDIES: I have personally reviewed the radiological images as listed and agreed with the findings in the report. No results found.  ASSESSMENT & PLAN Troy Orozco is a 68 y.o. male who presents to the clinic for evaluation of macrocytic anemia.   #Macrocytic anemia: --Differentials include nutritional deficiencies, medication induced or bone marrow disorders. --Recommend serologic workup today to check CBC, CMP, retic panel, peripheral smear.  --Check for nutritional deficiencies with vitamin b12, folate, copper  levels --Check for paraproteinemia with SPEP/IFE and serum free light chains --Check for hemolysis with haptoglobin levels --Tentative RTC in 3 months with labs and follow up unless above workup requires closer follow up.   Orders Placed This Encounter  Procedures  CBC with Differential    Standing Status:   Future    Number of Occurrences:   1    Expected Date:   08/17/2024    Expiration Date:   11/15/2024   Retic Panel    Standing Status:   Future    Number of Occurrences:   1    Expected Date:   08/17/2024    Expiration Date:   11/15/2024   Technologist smear review    Standing Status:   Future    Number of Occurrences:   1    Expected Date:   08/17/2024    Expiration Date:    11/15/2024    Clinical information::   macrocytic anemia   Comprehensive metabolic panel    Standing Status:   Future    Number of Occurrences:   1    Expected Date:   08/17/2024    Expiration Date:   11/15/2024   Vitamin B12    Standing Status:   Future    Number of Occurrences:   1    Expected Date:   08/17/2024    Expiration Date:   11/15/2024   Folate    Standing Status:   Future    Number of Occurrences:   1    Expected Date:   08/17/2024    Expiration Date:   11/15/2024   Haptoglobin    Standing Status:   Future    Number of Occurrences:   1    Expected Date:   08/17/2024    Expiration Date:   11/15/2024   Methylmalonic acid, serum    Standing Status:   Future    Number of Occurrences:   1    Expected Date:   08/17/2024    Expiration Date:   11/15/2024   Multiple Myeloma Panel (SPEP&IFE w/QIG)    Standing Status:   Future    Number of Occurrences:   1    Expected Date:   08/17/2024    Expiration Date:   11/15/2024   Kappa/lambda light chains    Standing Status:   Future    Number of Occurrences:   1    Expected Date:   08/17/2024    Expiration Date:   11/15/2024   Copper , serum    Standing Status:   Future    Number of Occurrences:   1    Expected Date:   08/17/2024    Expiration Date:   11/15/2024    All questions were answered. The patient knows to call the clinic with any problems, questions or concerns.  I have spent a total of 60 minutes minutes of face-to-face and non-face-to-face time, preparing to see the patient, obtaining and/or reviewing separately obtained history, performing a medically appropriate examination, counseling and educating the patient, ordering medications/tests/procedures, referring and communicating with other health care professionals, documenting clinical information in the electronic health record, independently interpreting results and communicating results to the patient, and care coordination.   Johnston Police, PA-C Department of  Hematology/Oncology [facility]

## 2024-08-17 ENCOUNTER — Inpatient Hospital Stay: Attending: Physician Assistant | Admitting: Physician Assistant

## 2024-08-17 ENCOUNTER — Encounter: Payer: Self-pay | Admitting: Physician Assistant

## 2024-08-17 ENCOUNTER — Inpatient Hospital Stay

## 2024-08-17 VITALS — BP 138/85 | HR 82 | Temp 98.3°F | Resp 18 | Ht 67.0 in | Wt 201.3 lb

## 2024-08-17 DIAGNOSIS — E039 Hypothyroidism, unspecified: Secondary | ICD-10-CM | POA: Diagnosis not present

## 2024-08-17 DIAGNOSIS — L509 Urticaria, unspecified: Secondary | ICD-10-CM

## 2024-08-17 DIAGNOSIS — D539 Nutritional anemia, unspecified: Secondary | ICD-10-CM | POA: Diagnosis not present

## 2024-08-17 DIAGNOSIS — Z79899 Other long term (current) drug therapy: Secondary | ICD-10-CM | POA: Diagnosis not present

## 2024-08-17 DIAGNOSIS — R0600 Dyspnea, unspecified: Secondary | ICD-10-CM

## 2024-08-17 DIAGNOSIS — F419 Anxiety disorder, unspecified: Secondary | ICD-10-CM | POA: Diagnosis not present

## 2024-08-17 DIAGNOSIS — Z803 Family history of malignant neoplasm of breast: Secondary | ICD-10-CM | POA: Insufficient documentation

## 2024-08-17 DIAGNOSIS — Z818 Family history of other mental and behavioral disorders: Secondary | ICD-10-CM

## 2024-08-17 LAB — COMPREHENSIVE METABOLIC PANEL WITH GFR
ALT: 16 U/L (ref 0–44)
AST: 26 U/L (ref 15–41)
Albumin: 4.2 g/dL (ref 3.5–5.0)
Alkaline Phosphatase: 85 U/L (ref 38–126)
Anion gap: 7 (ref 5–15)
BUN: 15 mg/dL (ref 8–23)
CO2: 25 mmol/L (ref 22–32)
Calcium: 9.1 mg/dL (ref 8.9–10.3)
Chloride: 108 mmol/L (ref 98–111)
Creatinine, Ser: 1.03 mg/dL (ref 0.61–1.24)
GFR, Estimated: >60 mL/min (ref >60)
Glucose, Bld: 97 mg/dL (ref 70–99)
Potassium: 4.2 mmol/L (ref 3.5–5.1)
Sodium: 140 mmol/L (ref 135–145)
Total Bilirubin: 0.7 mg/dL (ref 0.0–1.2)
Total Protein: 7 g/dL (ref 6.5–8.1)

## 2024-08-17 LAB — CBC WITH DIFFERENTIAL/PLATELET
Abs Immature Granulocytes: 0.02 K/uL (ref 0.00–0.07)
Basophils Absolute: 0.1 K/uL (ref 0.0–0.1)
Basophils Relative: 1 %
Eosinophils Absolute: 0.1 K/uL (ref 0.0–0.5)
Eosinophils Relative: 2 %
HCT: 40.5 % (ref 39.0–52.0)
Hemoglobin: 13.3 g/dL (ref 13.0–17.0)
Immature Granulocytes: 1 %
Lymphocytes Relative: 31 %
Lymphs Abs: 1.2 K/uL (ref 0.7–4.0)
MCH: 33.3 pg (ref 26.0–34.0)
MCHC: 32.8 g/dL (ref 30.0–36.0)
MCV: 101.5 fL — ABNORMAL HIGH (ref 80.0–100.0)
Monocytes Absolute: 0.5 K/uL (ref 0.1–1.0)
Monocytes Relative: 13 %
Neutro Abs: 2 K/uL (ref 1.7–7.7)
Neutrophils Relative %: 52 %
Platelets: 203 K/uL (ref 150–400)
RBC: 3.99 MIL/uL — ABNORMAL LOW (ref 4.22–5.81)
RDW: 12.3 % (ref 11.5–15.5)
WBC: 3.8 K/uL — ABNORMAL LOW (ref 4.0–10.5)
nRBC: 0 % (ref 0.0–0.2)

## 2024-08-17 LAB — VITAMIN B12: Vitamin B-12: 566 pg/mL (ref 180–914)

## 2024-08-17 LAB — TECHNOLOGIST SMEAR REVIEW: Plt Morphology: NORMAL

## 2024-08-17 LAB — RETIC PANEL
Immature Retic Fract: 5.2 % (ref 2.3–15.9)
RBC.: 3.98 MIL/uL — ABNORMAL LOW (ref 4.22–5.81)
Retic Count, Absolute: 40.2 K/uL (ref 19.0–186.0)
Retic Ct Pct: 1 % (ref 0.4–3.1)
Reticulocyte Hemoglobin: 32.6 pg (ref >27.9)

## 2024-08-17 LAB — FOLATE: Folate: >20 ng/mL (ref >5.9)

## 2024-08-18 LAB — KAPPA/LAMBDA LIGHT CHAINS
Kappa free light chain: 25.1 mg/L — ABNORMAL HIGH (ref 3.3–19.4)
Kappa, lambda light chain ratio: 1.35 (ref 0.26–1.65)
Lambda free light chains: 18.6 mg/L (ref 5.7–26.3)

## 2024-08-19 LAB — METHYLMALONIC ACID, SERUM: Methylmalonic Acid, Quantitative: 172 nmol/L (ref 0–378)

## 2024-08-19 LAB — HAPTOGLOBIN: Haptoglobin: 85 mg/dL (ref 32–363)

## 2024-08-20 LAB — COPPER, SERUM: Copper: 112 ug/dL (ref 69–132)

## 2024-08-21 LAB — MULTIPLE MYELOMA PANEL, SERUM
Albumin SerPl Elph-Mcnc: 3.9 g/dL (ref 2.9–4.4)
Albumin/Glob SerPl: 1.4 (ref 0.7–1.7)
Alpha 1: 0.2 g/dL (ref 0.0–0.4)
Alpha2 Glob SerPl Elph-Mcnc: 0.6 g/dL (ref 0.4–1.0)
B-Globulin SerPl Elph-Mcnc: 1 g/dL (ref 0.7–1.3)
Gamma Glob SerPl Elph-Mcnc: 1 g/dL (ref 0.4–1.8)
Globulin, Total: 2.9 g/dL (ref 2.2–3.9)
IgA: 199 mg/dL (ref 61–437)
IgG (Immunoglobin G), Serum: 933 mg/dL (ref 603–1613)
IgM (Immunoglobulin M), Srm: 176 mg/dL — ABNORMAL HIGH (ref 20–172)
Total Protein ELP: 6.8 g/dL (ref 6.0–8.5)

## 2024-08-24 ENCOUNTER — Telehealth (HOSPITAL_COMMUNITY): Payer: Self-pay | Admitting: Physician Assistant

## 2024-08-24 NOTE — Telephone Encounter (Signed)
 I called and spoke to patient's wife, Shawnee, to review the lab results from 08/17/2024. Findings show anemia has resolved and macrocytosis is nearly resolved. Remaining workup shows no evidence of nutritional deficiencies, hemolysis or paraproteinemia. Likely cause of his previous anemia could be due to flare up of his chronic inflammatory condition. No further hematologic workup is recommended at this time. Patient will return in 3 months to recheck labs. If there is no concern for abnormal blood counts, okay to discharge back to PCP.   Mrs. Rauf expressed understanding of the plan provided.

## 2024-09-07 NOTE — Progress Notes (Unsigned)
 No chief complaint on file.   HPI:    PMH: Past Medical History:  Diagnosis Date   Anxiety    Chronic idiopathic urticaria    Foot pain, bilateral    GERD (gastroesophageal reflux disease)    Hammertoe of right foot    Hypothyroidism    Leukocytoclastic vasculitis (HCC)    Low back pain     Surgical History: Past Surgical History:  Procedure Laterality Date   COLONOSCOPY     TOTAL HIP ARTHROPLASTY Left 07/28/2018   Procedure: LEFT TOTAL HIP ARTHROPLASTY ANTERIOR APPROACH;  Surgeon: Melodi Lerner, MD;  Location: [facility];  Service: Orthopedics;  Laterality: Left;     Home Medications:  Allergies as of 09/08/2024   No Known Allergies      Medication List        Accurate as of September 07, 2024  8:02 PM. If you have any questions, ask your nurse or doctor.          acetaminophen  650 MG CR tablet Commonly known as: TYLENOL  Take 650-1,300 mg by mouth every 8 (eight) hours as needed for pain.   dapsone 100 MG tablet Take 100 mg by mouth daily.   famotidine  40 MG tablet Commonly known as: Pepcid  Take 1 tablet (40 mg total) by mouth 2 (two) times daily.   fexofenadine  180 MG tablet Commonly known as: Allegra  Allergy Take 2 tablets (360 mg total) by mouth in the morning and at bedtime.   hydrOXYzine  10 MG tablet Commonly known as: ATARAX  Take 1 tablet (10 mg total) by mouth 3 (three) times daily as needed (for hives).   lidocaine  5 % ointment Commonly known as: XYLOCAINE  Apply 1-2 g topically 4 (four) times daily as needed for pain.   Magnesium 250 MG Tabs Take 250 mg by mouth daily.   methocarbamol  500 MG tablet Commonly known as: ROBAXIN  Take 1 tablet (500 mg total) by mouth 2 (two) times daily as needed for muscle spasms.   montelukast 10 MG tablet Commonly known as: SINGULAIR Take 10 mg by mouth at bedtime.   multivitamin with minerals Tabs tablet Take 1 tablet by mouth daily.   naproxen 500 MG tablet Commonly known as:  NAPROSYN Take 500 mg by mouth 2 (two) times daily with a meal.   NONFORMULARY OR COMPOUNDED ITEM Apply 1-2 g topically 4 (four) times daily as needed for pain. C-Bacl-Gaba-Ibu-Pril-2%-2%-5%-2% Cr   oseltamivir 75 MG capsule Commonly known as: TAMIFLU Take 75 mg by mouth 2 (two) times daily.   predniSONE 20 MG tablet Commonly known as: DELTASONE Take by mouth.   sertraline 50 MG tablet Commonly known as: ZOLOFT Take 50 mg by mouth daily.   SUPER B COMPLEX PO Take 1 capsule by mouth daily.   vitamin C 1000 MG tablet Take 1,000 mg by mouth daily.   Vitamin D-3 125 MCG (5000 UT) Tabs Take 5,000 Units by mouth daily.   Xolair 150 MG/ML prefilled syringe Generic drug: omalizumab Inject 300 mg into the skin every 14 (fourteen) days.        Allergies: No Known Allergies  Family History: Family History  Problem Relation Age of Onset   Breast cancer Mother        diagnosed > 50 years   Narcolepsy Father     Social History:  reports that he has never smoked. He has never used smokeless tobacco. He reports that he does not drink alcohol and does not use drugs.  ROS: All  other review of systems were reviewed and are negative except what is noted above in HPI  Physical Exam: There were no vitals taken for this visit.  Constitutional:  Alert and oriented, No acute distress. HEENT: [place] AT, moist mucus membranes.  Trachea midline, no masses. Cardiovascular: No clubbing, cyanosis, or edema. Respiratory: Normal respiratory effort, no increased work of breathing. GI: No inguinal hernias GU: Normal phallus. No masses/lesions on penis, testis, scrotum. Prostate ***g smooth no nodules no induration.  Lymph: No cervical or inguinal lymphadenopathy. Skin: No rashes, bruises or suspicious lesions. Neurologic: Grossly intact, no focal deficits, moving all 4 extremities. Psychiatric: Normal mood and affect.  Laboratory Data: Lab Results  Component Value Date   WBC 3.8 (L)  08/17/2024   HGB 13.3 08/17/2024   HCT 40.5 08/17/2024   MCV 101.5 (H) 08/17/2024   PLT 203 08/17/2024    Lab Results  Component Value Date   CREATININE 1.03 08/17/2024    No results found for: PSA  No results found for: TESTOSTERONE  No results found for: HGBA1C  Urinalysis   Pertinent Imaging: ***  Assessment:    Plan:    There are no diagnoses linked to this encounter.  No follow-ups on file.  Garnette CHRISTELLA Shack, MD  [facility] [place]

## 2024-09-08 ENCOUNTER — Ambulatory Visit: Admitting: Urology

## 2024-09-08 VITALS — BP 129/79 | HR 87

## 2024-09-08 DIAGNOSIS — R3914 Feeling of incomplete bladder emptying: Secondary | ICD-10-CM

## 2024-09-08 DIAGNOSIS — N138 Other obstructive and reflux uropathy: Secondary | ICD-10-CM

## 2024-09-08 DIAGNOSIS — R3915 Urgency of urination: Secondary | ICD-10-CM

## 2024-09-08 DIAGNOSIS — R350 Frequency of micturition: Secondary | ICD-10-CM | POA: Diagnosis not present

## 2024-09-08 DIAGNOSIS — R351 Nocturia: Secondary | ICD-10-CM

## 2024-09-08 DIAGNOSIS — N529 Male erectile dysfunction, unspecified: Secondary | ICD-10-CM

## 2024-09-08 DIAGNOSIS — Z125 Encounter for screening for malignant neoplasm of prostate: Secondary | ICD-10-CM

## 2024-09-08 DIAGNOSIS — N5201 Erectile dysfunction due to arterial insufficiency: Secondary | ICD-10-CM

## 2024-09-08 LAB — URINALYSIS, ROUTINE W REFLEX MICROSCOPIC
Bilirubin, UA: NEGATIVE
Glucose, UA: NEGATIVE
Ketones, UA: NEGATIVE
Leukocytes,UA: NEGATIVE
Nitrite, UA: NEGATIVE
Protein,UA: NEGATIVE
RBC, UA: NEGATIVE
Specific Gravity, UA: 1.025 (ref 1.005–1.030)
Urobilinogen, Ur: 0.2 mg/dL (ref 0.2–1.0)
pH, UA: 5.5 (ref 5.0–7.5)

## 2024-09-08 LAB — BLADDER SCAN AMB NON-IMAGING: Scan Result: 42

## 2024-09-08 MED ORDER — SILDENAFIL CITRATE 100 MG PO TABS: ORAL_TABLET | ORAL | 99 refills | Status: AC

## 2024-09-08 NOTE — Progress Notes (Signed)
 Bladder Scan completed today.  Patient can void prior to the bladder scan. Bladder scan result: 42  Performed By: [facility] LPN

## 2024-11-10 ENCOUNTER — Inpatient Hospital Stay

## 2024-11-10 ENCOUNTER — Other Ambulatory Visit: Payer: Self-pay

## 2024-11-10 DIAGNOSIS — D539 Nutritional anemia, unspecified: Secondary | ICD-10-CM

## 2024-11-11 ENCOUNTER — Inpatient Hospital Stay: Attending: Oncology

## 2024-11-11 DIAGNOSIS — Z820 Family history of epilepsy and other diseases of the nervous system: Secondary | ICD-10-CM | POA: Diagnosis not present

## 2024-11-11 DIAGNOSIS — G4700 Insomnia, unspecified: Secondary | ICD-10-CM | POA: Diagnosis not present

## 2024-11-11 DIAGNOSIS — R194 Change in bowel habit: Secondary | ICD-10-CM | POA: Insufficient documentation

## 2024-11-11 DIAGNOSIS — G8929 Other chronic pain: Secondary | ICD-10-CM | POA: Insufficient documentation

## 2024-11-11 DIAGNOSIS — M25512 Pain in left shoulder: Secondary | ICD-10-CM | POA: Insufficient documentation

## 2024-11-11 DIAGNOSIS — Z79899 Other long term (current) drug therapy: Secondary | ICD-10-CM | POA: Insufficient documentation

## 2024-11-11 DIAGNOSIS — R42 Dizziness and giddiness: Secondary | ICD-10-CM | POA: Diagnosis not present

## 2024-11-11 DIAGNOSIS — R350 Frequency of micturition: Secondary | ICD-10-CM | POA: Insufficient documentation

## 2024-11-11 DIAGNOSIS — R0602 Shortness of breath: Secondary | ICD-10-CM | POA: Diagnosis not present

## 2024-11-11 DIAGNOSIS — Z803 Family history of malignant neoplasm of breast: Secondary | ICD-10-CM | POA: Insufficient documentation

## 2024-11-11 DIAGNOSIS — R112 Nausea with vomiting, unspecified: Secondary | ICD-10-CM | POA: Diagnosis not present

## 2024-11-11 DIAGNOSIS — D539 Nutritional anemia, unspecified: Secondary | ICD-10-CM | POA: Diagnosis present

## 2024-11-11 LAB — COMPREHENSIVE METABOLIC PANEL WITH GFR
ALT: 13 U/L (ref 0–44)
AST: 27 U/L (ref 15–41)
Albumin: 4.5 g/dL (ref 3.5–5.0)
Alkaline Phosphatase: 120 U/L (ref 38–126)
Anion gap: 13 (ref 5–15)
BUN: 18 mg/dL (ref 8–23)
CO2: 23 mmol/L (ref 22–32)
Calcium: 9.4 mg/dL (ref 8.9–10.3)
Chloride: 105 mmol/L (ref 98–111)
Creatinine, Ser: 0.96 mg/dL (ref 0.61–1.24)
GFR, Estimated: >60 mL/min (ref >60)
Glucose, Bld: 105 mg/dL — ABNORMAL HIGH (ref 70–99)
Potassium: 4.2 mmol/L (ref 3.5–5.1)
Sodium: 140 mmol/L (ref 135–145)
Total Bilirubin: 0.6 mg/dL (ref 0.0–1.2)
Total Protein: 7.3 g/dL (ref 6.5–8.1)

## 2024-11-11 LAB — CBC WITH DIFFERENTIAL/PLATELET
Abs Immature Granulocytes: 0 K/uL (ref 0.00–0.07)
Basophils Absolute: 0.1 K/uL (ref 0.0–0.1)
Basophils Relative: 1 %
Eosinophils Absolute: 0.1 K/uL (ref 0.0–0.5)
Eosinophils Relative: 2 %
HCT: 42.9 % (ref 39.0–52.0)
Hemoglobin: 14.7 g/dL (ref 13.0–17.0)
Immature Granulocytes: 0 %
Lymphocytes Relative: 24 %
Lymphs Abs: 1.2 K/uL (ref 0.7–4.0)
MCH: 31.5 pg (ref 26.0–34.0)
MCHC: 34.3 g/dL (ref 30.0–36.0)
MCV: 92.1 fL (ref 80.0–100.0)
Monocytes Absolute: 0.6 K/uL (ref 0.1–1.0)
Monocytes Relative: 12 %
Neutro Abs: 3.1 K/uL (ref 1.7–7.7)
Neutrophils Relative %: 61 %
Platelets: 225 K/uL (ref 150–400)
RBC: 4.66 MIL/uL (ref 4.22–5.81)
RDW: 13.2 % (ref 11.5–15.5)
WBC: 5.1 K/uL (ref 4.0–10.5)
nRBC: 0 % (ref 0.0–0.2)

## 2024-11-17 ENCOUNTER — Inpatient Hospital Stay: Admitting: Oncology

## 2024-11-17 VITALS — BP 149/87 | HR 94 | Temp 98.2°F | Resp 18 | Wt 196.0 lb

## 2024-11-17 DIAGNOSIS — D539 Nutritional anemia, unspecified: Secondary | ICD-10-CM | POA: Diagnosis not present

## 2024-11-17 NOTE — Progress Notes (Unsigned)
 [facility]  INITIAL CONSULT NOTE  Patient Care Team: Kayla Drivers, MD as PCP - General (Family Medicine) Debera Jayson MATSU, MD as PCP - Cardiology (Cardiology)  Hematological/Oncological History Labs from [facility]: 01/09/2024: WBC 6.5, Hgb 11.9 (L), MCV 111 (H), Plt 188 06/27/2024: Iron 125, TIBC 278, ferritin 307, iron saturation 45%, folate >20, Vitamin b12 1025, Wbc 6.4, Hgb 11.8 (L), MCV 110 (H), 216,  Labs from [facility] immunologist: WBC 6.8, Hgb 12.3 (L), MCV 101.6 (H), Plt 200, Creatinine 0.90, LFTs normal, CRP and sed rate normal, SPEP/IFE did not detect M protein but demonstrated an irregularity int he gamma region which may represent M protein. Kappa light chain 2.32 (H), lambda light chain 1.87, ratio 1/24.  08/17/2024: Establish care with [facility]  CHIEF COMPLAINTS/PURPOSE OF CONSULTATION:  Macrocytic anemia  HISTORY OF PRESENTING ILLNESS:  Troy Orozco 68 y.o. 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 He is alone today.  Exam today, Troy Orozco reports low energy levels but slightly better than previous.  Reports insomnia times.  Appetite is 100%.  He has chronic pain in his left shoulder and left hip.  Previous history of left total hip replacement.  He will eventually have to have his left shoulder replaced and is followed by Dr. Ivonne.  Some shortness of breath and dizziness times.  Has urinary frequency at nighttime.  Reports his blood pressure was a little elevated this morning.  Any nausea, vomiting or bowel habit changes.  Denies any bruising or signs of bleeding. He denies fevers, chills, sweats, chest pain, cough, headaches or dizziness. He has no other complaints. Rest of the ROS is below.   MEDICAL HISTORY:  Past Medical History:  Diagnosis Date   Anxiety    Chronic idiopathic  urticaria    Foot pain, bilateral    GERD (gastroesophageal reflux disease)    Hammertoe of right foot    Hypothyroidism    Leukocytoclastic vasculitis (HCC)    Low back pain     SURGICAL HISTORY: Past Surgical History:  Procedure Laterality Date   COLONOSCOPY     TOTAL HIP ARTHROPLASTY Left 07/28/2018   Procedure: LEFT TOTAL HIP ARTHROPLASTY ANTERIOR APPROACH;  Surgeon: Melodi Lerner, MD;  Location: [facility];  Service: Orthopedics;  Laterality: Left;     SOCIAL HISTORY: Social History   Socioeconomic History   Marital status: Married    Spouse name: Not on file   Number of children: Not on file   Years of education: Not on file   Highest education level: Not on file  Occupational History   Not on file  Tobacco Use   Smoking status: Never   Smokeless tobacco: Never  Vaping Use   Vaping status: Never Used  Substance and Sexual Activity   Alcohol use: Never   Drug use: Never   Sexual activity: Not on file  Other Topics Concern   Not on file  Social History Narrative   Not on file   Social Drivers of Health   Tobacco Use: Low Risk (09/23/2024)   Received from [facility]   Patient History    Smoking Tobacco Use: Never    Smokeless Tobacco Use: Never    Passive Exposure: Not on file  Financial Resource Strain: Not on file  Food Insecurity: No Food Insecurity (08/17/2024)   Epic    Worried About Programme Researcher, Broadcasting/film/video in  the Last Year: Never true    Ran Out of Food in the Last Year: Never true  Transportation Needs: No Transportation Needs (08/17/2024)   Epic    Lack of Transportation (Medical): No    Lack of Transportation (Non-Medical): No  Physical Activity: Not on file  Stress: Not on file  Social Connections: Not on file  Intimate Partner Violence: Not At Risk (08/17/2024)   Epic    Fear of Current or Ex-Partner: No    Emotionally Abused: No    Physically Abused: No    Sexually Abused: No  Depression (PHQ2-9): Low Risk (11/17/2024)   Depression  (PHQ2-9)    PHQ-2 Score: 0  Alcohol Screen: Not on file  Housing: Low Risk (08/17/2024)   Epic    Unable to Pay for Housing in the Last Year: No    Number of Times Moved in the Last Year: 0    Homeless in the Last Year: No  Utilities: Not At Risk (08/17/2024)   Epic    Threatened with loss of utilities: No  Health Literacy: Not on file    FAMILY HISTORY: Family History  Problem Relation Age of Onset   Breast cancer Mother        diagnosed > 50 years   Narcolepsy Father     ALLERGIES:  has no known allergies.  MEDICATIONS:  Current Outpatient Medications  Medication Sig Dispense Refill   acetaminophen  (TYLENOL ) 650 MG CR tablet Take 650-1,300 mg by mouth every 8 (eight) hours as needed for pain.     Ascorbic Acid (VITAMIN C) 1000 MG tablet Take 1,000 mg by mouth daily.     B Complex-C (SUPER B COMPLEX PO) Take 1 capsule by mouth daily.     Cholecalciferol (VITAMIN D-3) 5000 units TABS Take 5,000 Units by mouth daily.     dapsone 100 MG tablet Take 100 mg by mouth daily.     famotidine  (PEPCID ) 40 MG tablet Take 1 tablet (40 mg total) by mouth 2 (two) times daily. 60 tablet 1   fexofenadine  (ALLEGRA  ALLERGY) 180 MG tablet Take 2 tablets (360 mg total) by mouth in the morning and at bedtime. 120 tablet 1   hydrOXYzine  (ATARAX ) 10 MG tablet Take 1 tablet (10 mg total) by mouth 3 (three) times daily as needed (for hives). 60 tablet 0   levothyroxine  (SYNTHROID ) 50 MCG tablet Take 50 mcg by mouth daily.     lidocaine  (XYLOCAINE ) 5 % ointment Apply 1-2 g topically 4 (four) times daily as needed for pain.  0   Magnesium 250 MG TABS Take 250 mg by mouth daily.     methocarbamol  (ROBAXIN ) 500 MG tablet Take 1 tablet (500 mg total) by mouth 2 (two) times daily as needed for muscle spasms. 20 tablet 0   montelukast (SINGULAIR) 10 MG tablet Take 10 mg by mouth at bedtime.     Multiple Vitamin (MULTIVITAMIN WITH MINERALS) TABS tablet Take 1 tablet by mouth daily.     naproxen (NAPROSYN) 500  MG tablet Take 500 mg by mouth 2 (two) times daily with a meal.     NONFORMULARY OR COMPOUNDED ITEM Apply 1-2 g topically 4 (four) times daily as needed for pain. C-Bacl-Gaba-Ibu-Pril-2%-2%-5%-2% Cr  99   oseltamivir (TAMIFLU) 75 MG capsule Take 75 mg by mouth 2 (two) times daily.     predniSONE (DELTASONE) 20 MG tablet Take by mouth.     sertraline (ZOLOFT) 50 MG tablet Take 50 mg by mouth daily.  sildenafil  (VIAGRA ) 100 MG tablet Take 1/2 to 1 tablet p.o. as needed 20 tablet prn   tamsulosin (FLOMAX) 0.4 MG CAPS capsule Take by mouth.     XOLAIR 150 MG/ML prefilled syringe Inject 300 mg into the skin every 14 (fourteen) days.     No current facility-administered medications for this visit.    REVIEW OF SYSTEMS:   Review of Systems  Constitutional:  Positive for malaise/fatigue.  Respiratory:  Positive for shortness of breath.   Genitourinary:  Positive for frequency.  Musculoskeletal:  Positive for joint pain.  Neurological:  Positive for dizziness.     PHYSICAL EXAMINATION: ECOG PERFORMANCE STATUS: 1 - Symptomatic but completely ambulatory  Vitals:   11/17/24 1028 11/17/24 1037  BP: (!) 158/97 (!) 149/87  Pulse: 94   Resp: 18   Temp: 98.2 F (36.8 C)   SpO2: 97%     Filed Weights   11/17/24 1028  Weight: 196 lb (88.9 kg)     Physical Exam Constitutional:      Appearance: Normal appearance.  Cardiovascular:     Rate and Rhythm: Normal rate and regular rhythm.  Pulmonary:     Effort: Pulmonary effort is normal.     Breath sounds: Normal breath sounds.  Abdominal:     General: Bowel sounds are normal.     Palpations: Abdomen is soft.  Musculoskeletal:        General: No swelling. Normal range of motion.  Neurological:     Mental Status: He is alert and oriented to person, place, and time. Mental status is at baseline.      LABORATORY DATA:  I have reviewed the data as listed    Latest Ref Rng & Units 11/11/2024   12:02 PM 08/17/2024   10:11 AM  01/09/2024    5:26 AM  CBC  WBC 4.0 - 10.5 K/uL 5.1  3.8  6.5   Hemoglobin 13.0 - 17.0 g/dL 85.2  86.6  88.0   Hematocrit 39.0 - 52.0 % 42.9  40.5  37.4   Platelets 150 - 400 K/uL 225  203  188        Latest Ref Rng & Units 11/11/2024   12:02 PM 08/17/2024   10:11 AM 01/09/2024    5:26 AM  CMP  Glucose 70 - 99 mg/dL 894  97  889   BUN 8 - 23 mg/dL 18  15  23    Creatinine 0.61 - 1.24 mg/dL 9.03  8.96  9.03   Sodium 135 - 145 mmol/L 140  140  138   Potassium 3.5 - 5.1 mmol/L 4.2  4.2  3.7   Chloride 98 - 111 mmol/L 105  108  102   CO2 22 - 32 mmol/L 23  25  25    Calcium 8.9 - 10.3 mg/dL 9.4  9.1  9.3   Total Protein 6.5 - 8.1 g/dL 7.3  7.0  6.7   Total Bilirubin 0.0 - 1.2 mg/dL 0.6  0.7  0.9   Alkaline Phos 38 - 126 U/L 120  85  95   AST 15 - 41 U/L 27  26  31    ALT 0 - 44 U/L 13  16  21     RADIOGRAPHIC STUDIES: I have personally reviewed the radiological images as listed and agreed with the findings in the report. No results found.  ASSESSMENT & PLAN Troy Orozco is a 68 y.o. male who presents to the clinic for evaluation of macrocytic anemia.   #Macrocytic anemia: Findings  show anemia has resolved and macrocytosis is nearly resolved. Remaining workup shows no evidence of nutritional deficiencies, hemolysis or paraproteinemia. Likely cause of his previous anemia could be due to flare up of his chronic inflammatory condition. No further hematologic workup is recommended at this time. Repeat labs from 11/11/2024 showed normal CBC with resolution of anemia and macrocytosis. CMP is also unremarkable. No additional follow-up needed.  No orders of the defined types were placed in this encounter.   All questions were answered. The patient knows to call the clinic with any problems, questions or concerns.  I spent 20 minutes dedicated to the care of this patient (face-to-face and non-face-to-face) on the date of the encounter to include what is described in the assessment and  plan.   Troy Orozco, AGNP-C Department of Hematology/Oncology [facility]  Phone: [phone]  11/18/2024 8:04 AM

## 2024-11-17 NOTE — Patient Instructions (Signed)
 Okay for discharge.   CBC    Component Value Date/Time   WBC 5.1 11/11/2024 1202   RBC 4.66 11/11/2024 1202   HGB 14.7 11/11/2024 1202   HCT 42.9 11/11/2024 1202   PLT 225 11/11/2024 1202   MCV 92.1 11/11/2024 1202   MCH 31.5 11/11/2024 1202   MCHC 34.3 11/11/2024 1202   RDW 13.2 11/11/2024 1202   LYMPHSABS 1.2 11/11/2024 1202   MONOABS 0.6 11/11/2024 1202   EOSABS 0.1 11/11/2024 1202   BASOSABS 0.1 11/11/2024 1202   Hemoglobin is 14.7/ MCV is normalized.   No longer macrocytic. No longer anemic.   RTC as needed.

## 2024-11-30 ENCOUNTER — Encounter: Payer: Self-pay | Admitting: *Deleted

## 2025-02-02 ENCOUNTER — Ambulatory Visit: Admitting: Urology
